# Patient Record
Sex: Female | Born: 1950 | Race: White | Hispanic: No | Marital: Single | Smoking: Former smoker
Health system: Southern US, Community
[De-identification: ages and names within clinical notes are randomized; demographics above are authoritative.]

## PROBLEM LIST (undated history)

## (undated) DIAGNOSIS — E785 Hyperlipidemia, unspecified: Secondary | ICD-10-CM

## (undated) DIAGNOSIS — M1990 Unspecified osteoarthritis, unspecified site: Secondary | ICD-10-CM

## (undated) DIAGNOSIS — I10 Essential (primary) hypertension: Secondary | ICD-10-CM

## (undated) DIAGNOSIS — J45909 Unspecified asthma, uncomplicated: Secondary | ICD-10-CM

## (undated) DIAGNOSIS — E669 Obesity, unspecified: Secondary | ICD-10-CM

## (undated) DIAGNOSIS — N189 Chronic kidney disease, unspecified: Secondary | ICD-10-CM

## (undated) DIAGNOSIS — R5383 Other fatigue: Secondary | ICD-10-CM

## (undated) DIAGNOSIS — M810 Age-related osteoporosis without current pathological fracture: Secondary | ICD-10-CM

## (undated) HISTORY — DX: Chronic kidney disease, unspecified: N18.9

## (undated) HISTORY — DX: Other fatigue: R53.83

## (undated) HISTORY — DX: Unspecified osteoarthritis, unspecified site: M19.90

## (undated) HISTORY — DX: Unspecified asthma, uncomplicated: J45.909

## (undated) HISTORY — DX: Hyperlipidemia, unspecified: E78.5

## (undated) HISTORY — DX: Obesity, unspecified: E66.9

## (undated) HISTORY — DX: Essential (primary) hypertension: I10

## (undated) HISTORY — PX: MANDIBLE SURGERY: SHX707

## (undated) HISTORY — DX: Age-related osteoporosis without current pathological fracture: M81.0

## (undated) HISTORY — PX: NASAL POLYP EXCISION: SHX2068

## (undated) HISTORY — PX: CATARACT EXTRACTION: SUR2

---

## 1990-02-11 HISTORY — PX: MAXILLA OSTEOTOMY: SHX1008

## 2014-09-30 DIAGNOSIS — H5213 Myopia, bilateral: Secondary | ICD-10-CM | POA: Diagnosis not present

## 2014-09-30 DIAGNOSIS — H25043 Posterior subcapsular polar age-related cataract, bilateral: Secondary | ICD-10-CM | POA: Diagnosis not present

## 2014-09-30 DIAGNOSIS — H25012 Cortical age-related cataract, left eye: Secondary | ICD-10-CM | POA: Diagnosis not present

## 2014-09-30 DIAGNOSIS — H2513 Age-related nuclear cataract, bilateral: Secondary | ICD-10-CM | POA: Diagnosis not present

## 2014-10-07 DIAGNOSIS — N182 Chronic kidney disease, stage 2 (mild): Secondary | ICD-10-CM | POA: Diagnosis not present

## 2014-10-07 DIAGNOSIS — E559 Vitamin D deficiency, unspecified: Secondary | ICD-10-CM | POA: Diagnosis not present

## 2014-10-07 DIAGNOSIS — E782 Mixed hyperlipidemia: Secondary | ICD-10-CM | POA: Diagnosis not present

## 2014-10-10 DIAGNOSIS — T148 Other injury of unspecified body region: Secondary | ICD-10-CM | POA: Diagnosis not present

## 2014-10-10 DIAGNOSIS — Z23 Encounter for immunization: Secondary | ICD-10-CM | POA: Diagnosis not present

## 2014-10-10 DIAGNOSIS — N183 Chronic kidney disease, stage 3 (moderate): Secondary | ICD-10-CM | POA: Diagnosis not present

## 2014-10-10 DIAGNOSIS — Z1389 Encounter for screening for other disorder: Secondary | ICD-10-CM | POA: Diagnosis not present

## 2014-10-10 DIAGNOSIS — J45909 Unspecified asthma, uncomplicated: Secondary | ICD-10-CM | POA: Diagnosis not present

## 2014-10-10 DIAGNOSIS — R5383 Other fatigue: Secondary | ICD-10-CM | POA: Diagnosis not present

## 2014-10-10 DIAGNOSIS — Z6834 Body mass index (BMI) 34.0-34.9, adult: Secondary | ICD-10-CM | POA: Diagnosis not present

## 2014-10-10 DIAGNOSIS — Z0000 Encounter for general adult medical examination without abnormal findings: Secondary | ICD-10-CM | POA: Diagnosis not present

## 2014-10-10 DIAGNOSIS — I10 Essential (primary) hypertension: Secondary | ICD-10-CM | POA: Diagnosis not present

## 2014-10-10 DIAGNOSIS — M810 Age-related osteoporosis without current pathological fracture: Secondary | ICD-10-CM | POA: Diagnosis not present

## 2014-10-10 DIAGNOSIS — E782 Mixed hyperlipidemia: Secondary | ICD-10-CM | POA: Diagnosis not present

## 2014-10-10 DIAGNOSIS — E559 Vitamin D deficiency, unspecified: Secondary | ICD-10-CM | POA: Diagnosis not present

## 2014-10-18 DIAGNOSIS — Z1212 Encounter for screening for malignant neoplasm of rectum: Secondary | ICD-10-CM | POA: Diagnosis not present

## 2014-11-09 ENCOUNTER — Ambulatory Visit (INDEPENDENT_AMBULATORY_CARE_PROVIDER_SITE_OTHER): Payer: Medicare Other | Admitting: Neurology

## 2014-11-09 ENCOUNTER — Encounter: Payer: Self-pay | Admitting: Neurology

## 2014-11-09 VITALS — BP 134/92 | HR 88 | Resp 16 | Ht 67.25 in | Wt 222.0 lb

## 2014-11-09 DIAGNOSIS — R351 Nocturia: Secondary | ICD-10-CM | POA: Diagnosis not present

## 2014-11-09 DIAGNOSIS — E669 Obesity, unspecified: Secondary | ICD-10-CM

## 2014-11-09 DIAGNOSIS — G2581 Restless legs syndrome: Secondary | ICD-10-CM | POA: Diagnosis not present

## 2014-11-09 DIAGNOSIS — R0683 Snoring: Secondary | ICD-10-CM | POA: Diagnosis not present

## 2014-11-09 DIAGNOSIS — R600 Localized edema: Secondary | ICD-10-CM | POA: Diagnosis not present

## 2014-11-09 DIAGNOSIS — G4719 Other hypersomnia: Secondary | ICD-10-CM

## 2014-11-09 NOTE — Patient Instructions (Signed)

## 2014-11-09 NOTE — Progress Notes (Signed)
Subjective:    [name] ID: Traci [name] is a 64 y.o. female.  HPI     [name], MD, [name], [name], [name]  Dear Dr. Dagmar Hait,   I saw your [name], Traci [name], upon your kind request in my neurologic clinic today for initial consultation of her sleep disorder, in particular, concern for underlying obstructive sleep apnea. Traci [name] is unaccompanied today. As you know, Traci [name] is a very pleasant 64 year old right-handed woman with an underlying medical history of allergic rhinitis, asthma, LE edema, hypertension, chronic kidney disease, osteoporosis, hyperlipidemia, vitamin D deficiency, degenerative joint disease, and obesity, who reports snoring and excessive daytime somnolence, not waking up rested for Traci past 6 months or more. I reviewed your office note from 10/12/2014 which you kindly included. She has had chronic LE distal edema for years, maybe 20+ years. She gained weight consistently since menopause. She has sinus issues. She had sinus surgery some 16 years ago.  She has occasional RLS symptoms, but does not know if she kicks in her sleep.  She lives alone. She has 2 dogs and 1, and no pets in her bed. She goes to bed around 9 PM and falls asleep fairly quickly. She does wake up multiple times in Traci middle of Traci night often from itching in her legs from her swelling and rash. Sometimes she uses cortisone cream on her legs. She has had no morning headaches. She has nocturia 2 or 3 times on an average night. She drinks caffeine in Traci form of coffee in Traci morning and ice tea in Traci middle of Traci day. She drinks 2 glasses of wine with dinner. She quit smoking in 1977. Her brother has obstructive sleep apnea and uses a CPAP machine. He also had heart surgery secondary to congenital heart disease. She takes multiple naps during Traci day. [name], [name] She works from home. She is an Training and development officer.  Her Past Medical History Is Significant For: Past Medical History  Diagnosis Date  . Asthma   . Osteoporosis   . Hypertension   . Chronic kidney disease   . DJD (degenerative joint disease)   . Hyperlipidemia   . Fatigue   . Obesity     Her Past Surgical History Is Significant For: Past Surgical History  Procedure Laterality Date  . Mandible surgery    . Nasal polyp excision    . Cataract extraction    . Maxilla osteotomy  1992    Her Family History Is Significant For: Family History  Problem Relation Age of Onset  . Hypertension Mother   . Parkinson's disease Father   . Heart Problems Brother   . Angina Maternal Grandmother   . Heart attack Maternal Grandfather     Her Social History Is Significant For: Social History   Social History  . Marital Status: Single    Spouse Name: N/A  . Number of Children: 0  . Years of Education: Masters   Occupational History  . Self Employeed    Social History Main Topics  . Smoking status: Former Research scientist (life sciences)  . Smokeless tobacco: None     Comment: Quit 1977  . Alcohol Use: No  . Drug Use:  No  . Sexual Activity: Not Asked   Other Topics Concern  . None   Social History Narrative   Drinks about 1 cup of coffee a day     Her Allergies Are:  No Known Allergies:   Her Current Medications Are:  Outpatient Encounter Prescriptions as of 11/09/2014  Medication Sig  . alendronate (FOSAMAX) 10 MG tablet Take 10 mg by mouth daily before breakfast. Take with a full glass of water on an empty stomach.  Marland Kitchen amLODipine (NORVASC) 5 MG tablet Take 5 mg by mouth daily.  Marland Kitchen aspirin EC 325 MG tablet Take 325 mg by mouth daily.  Marland Kitchen atorvastatin (LIPITOR) 10 MG tablet Take 10 mg by mouth daily.  . Fluticasone-Salmeterol (ADVAIR) 250-50 MCG/DOSE AEPB Inhale 1  puff into Traci lungs 2 (two) times daily.  Marland Kitchen losartan (COZAAR) 50 MG tablet Take 50 mg by mouth daily.  . traMADol (ULTRAM) 50 MG tablet Take by mouth every 6 (six) hours as needed.  . Vitamin D, Ergocalciferol, (DRISDOL) 50000 UNITS CAPS capsule Take 50,000 Units by mouth every 7 (seven) days.   No facility-administered encounter medications on file as of 11/09/2014.  :  Review of Systems:  Out of a complete 14 point review of systems, all are reviewed and negative with Traci exception of these symptoms as listed below:  Review of Systems  Constitutional: Positive for fatigue.  Eyes:       Blurred vision   Respiratory: Positive for shortness of breath and wheezing.   Cardiovascular: Positive for leg swelling.  Musculoskeletal:       Joint pain   Allergic/Immunologic: Positive for environmental allergies.  Neurological:       Sometimes has trouble falling asleep, nocturia, wakes up feeling tired especially in last year, takes naps during Traci day, daytime tiredness.    Psychiatric/Behavioral:       Decreased energy     Objective:  Neurologic Exam  Physical Exam Physical Examination:   Filed Vitals:   11/09/14 1012  BP: 134/92  Pulse: 88  Resp: 16   General Examination: Traci [name] is a very pleasant 64 y.o. female in no acute distress. She appears well-developed and well-nourished and well groomed.   HEENT: Normocephalic, atraumatic, pupils are equal, round and reactive to light and accommodation. Funduscopic exam is normal with sharp disc margins noted. Extraocular tracking is good without limitation to gaze excursion or nystagmus noted. Normal smooth pursuit is noted. Hearing is grossly intact. She has bilateral cataracts, left more than right. Face is symmetric with normal facial animation and normal facial sensation. Speech is clear with no dysarthria noted. There is no hypophonia. There is no lip, neck/head, jaw or voice tremor. Neck is supple with full range of passive and  active motion. There are no carotid bruits on auscultation. Oropharynx exam reveals: mild mouth dryness, adequate dental hygiene and mild airway crowding, due to narrow airway entry. Mallampati is class II. Tongue protrudes centrally and palate elevates symmetrically. Tonsils are small or (? Absent). Neck size is 17 3/8 inches. She has a Mild overbite. Nasal inspection reveals no significant nasal mucosal bogginess or redness and no septal deviation.   Chest: Clear to auscultation without wheezing, rhonchi or crackles noted.  Heart: S1+S2+0, regular and normal without murmurs, rubs or gallops noted.   Abdomen: Soft, non-tender and non-distended with normal bowel sounds appreciated on auscultation.  Extremities: There is 2+ pitting edema in Traci distal lower extremities bilaterally. Pedal pulses are intact.  Skin: Warm and  dry with trophic changes noted and mild redness and a rash, stasis-type. There are no varicose veins.  Musculoskeletal: exam reveals no obvious joint deformities, tenderness or joint swelling or erythema.   Neurologically:  Mental status: Traci [name] is awake, alert and oriented in all 4 spheres. Her immediate and remote memory, attention, language skills and fund of knowledge are appropriate. There is no evidence of aphasia, agnosia, apraxia or anomia. Speech is clear with normal prosody and enunciation. Thought process is linear. Mood is normal and affect is normal.  Cranial nerves II - XII are as described above under HEENT exam. In addition: shoulder shrug is normal with equal shoulder height noted. Motor exam: Normal bulk, strength and tone is noted. There is no drift, tremor or rebound. Romberg is negative. Reflexes are 2+ throughout. Babinski: Toes are flexor bilaterally. Fine motor skills and coordination: intact with normal finger taps, normal hand movements, normal rapid alternating patting, normal foot taps and normal foot agility.  Cerebellar testing: No dysmetria or  intention tremor on finger to nose testing. Heel to shin is unremarkable bilaterally. There is no truncal or gait ataxia.  Sensory exam: intact to light touch, pinprick, vibration, temperature sense in Traci upper and lower extremities.  Gait, station and balance: She stands easily. No veering to one side is noted. No leaning to one side is noted. Posture is age-appropriate and stance is narrow based. Gait shows normal stride length and normal pace. No problems turning are noted. She turns en bloc. Tandem walk is difficult for her.   Assessment and Plan:   In summary, Traci [name] is a very pleasant 64 y.o.-year old female with an underlying medical history of allergic rhinitis, asthma, hypertension, chronic kidney disease, osteoporosis, hyperlipidemia, vitamin D deficiency, degenerative joint disease, and obesity, whose history and physical exam are indeed concerning for underlying obstructive sleep apnea (OSA). in addition, she endorses restless leg syndrome type symptoms.  I had a long chat with Traci [name] about my findings and Traci diagnosis of OSA, its prognosis and treatment options. We talked about medical treatments, surgical interventions and non-pharmacological approaches. I explained in particular Traci risks and ramifications of untreated moderate to severe OSA, especially with respect to developing cardiovascular disease down Traci Road, including congestive heart failure, difficult to treat hypertension, cardiac arrhythmias, or stroke. Even type 2 diabetes has, in part, been linked to untreated OSA. Symptoms of untreated OSA include daytime sleepiness, memory problems, mood irritability and mood disorder such as depression and anxiety, lack of energy, as well as recurrent headaches, especially morning headaches. We talked about trying to maintain a healthy lifestyle in general, as well as Traci importance of weight control. I encouraged Traci [name] to eat healthy, exercise daily and keep well  hydrated, to keep a scheduled bedtime and wake time routine, to not skip any meals and eat healthy snacks in between meals. I advised Traci [name] not to drive when feeling sleepy. I recommended Traci following at this time: sleep study with potential positive airway pressure titration. (We will score hypopneas at 4% and split Traci sleep study into diagnostic and treatment portion, if Traci estimated. 2 hour AHI is >15/h).   I explained Traci sleep test procedure to Traci [name] and also outlined possible surgical and non-surgical treatment options of OSA, including Traci use of a custom-made dental device (which would require a referral to a specialist dentist or oral surgeon), upper airway surgical options, such as pillar implants, radiofrequency surgery, tongue base surgery, and UPPP (  which would involve a referral to an ENT surgeon). Rarely, jaw surgery such as mandibular advancement may be considered.  I also explained Traci CPAP treatment option to Traci [name], who indicated that she would be willing to try CPAP if Traci need arises. I explained Traci importance of being compliant with PAP treatment, not only for insurance purposes but primarily to improve Her symptoms, and for Traci [name]'s long term health benefit, including to reduce Her cardiovascular risks. I answered all her questions today and Traci [name] was in agreement. I would like to see her back after Traci sleep study is completed and encouraged her to call with any interim questions, concerns, problems or updates.   Thank you very much for allowing me to participate in Traci care of this nice [name]. If I can be of any further assistance to you please do not hesitate to call me at [phone].  Sincerely,   [name], MD, PhD

## 2014-11-23 ENCOUNTER — Ambulatory Visit (INDEPENDENT_AMBULATORY_CARE_PROVIDER_SITE_OTHER): Payer: Medicare Other | Admitting: Neurology

## 2014-11-23 ENCOUNTER — Encounter (INDEPENDENT_AMBULATORY_CARE_PROVIDER_SITE_OTHER): Payer: Self-pay | Admitting: Neurology

## 2014-11-23 DIAGNOSIS — E669 Obesity, unspecified: Secondary | ICD-10-CM

## 2014-11-23 DIAGNOSIS — G4710 Hypersomnia, unspecified: Secondary | ICD-10-CM

## 2014-11-23 DIAGNOSIS — R0683 Snoring: Secondary | ICD-10-CM

## 2014-11-23 DIAGNOSIS — G2581 Restless legs syndrome: Secondary | ICD-10-CM

## 2014-11-23 DIAGNOSIS — R351 Nocturia: Secondary | ICD-10-CM

## 2014-11-23 DIAGNOSIS — R600 Localized edema: Secondary | ICD-10-CM

## 2014-11-23 DIAGNOSIS — Z0289 Encounter for other administrative examinations: Secondary | ICD-10-CM

## 2014-11-23 DIAGNOSIS — G4719 Other hypersomnia: Secondary | ICD-10-CM

## 2014-11-23 NOTE — Sleep Study (Signed)
Please see the scanned sleep study interpretation located in the procedure tab in the chart view section.  

## 2014-11-24 NOTE — Sleep Study (Signed)
Please see the scanned sleep study interpretation located in the procedure tab within the chart review section.   

## 2014-11-28 ENCOUNTER — Telehealth: Payer: Self-pay | Admitting: Neurology

## 2014-11-28 DIAGNOSIS — G4733 Obstructive sleep apnea (adult) (pediatric): Secondary | ICD-10-CM

## 2014-11-28 DIAGNOSIS — G4734 Idiopathic sleep related nonobstructive alveolar hypoventilation: Secondary | ICD-10-CM

## 2014-11-28 NOTE — Telephone Encounter (Signed)
Patient referred by Dr. Dagmar Hait, seen by me on 11/09/14, diagnostic PSG on 11/23/14, ins: MCR.   Please call and notify the patient that the recent sleep study did confirm the diagnosis of obstructive sleep apnea, which was severe in dream sleep with significant desaturations into the 70s even, and that I recommend treatment for this in the form of CPAP. This will require a repeat sleep study for proper titration and mask fitting. Please explain to patient and arrange for a CPAP titration study. I have placed an order in the chart. Thanks, and please route to [facility] for scheduling next sleep study.  [name], MD, PhD [facility])

## 2014-11-29 NOTE — Telephone Encounter (Signed)
I spoke to patient and she is aware of results and recommendations. She is willing to proceed with titration study but would like to hold off for 4-6 weeks. Patient states that she is new to Medicare and has not received any bills yet. She is afraid that she will not be able to afford 2nd study. Carlea says that she wants to call us back in 4-6 weeks to schedule study. She is aware of how low her oxygen levels drop during the night.

## 2014-11-29 NOTE — Telephone Encounter (Signed)
[phone], Pt returned call about results

## 2014-11-29 NOTE — Telephone Encounter (Signed)
Left message to call back for results

## 2015-04-04 DIAGNOSIS — H25013 Cortical age-related cataract, bilateral: Secondary | ICD-10-CM | POA: Diagnosis not present

## 2015-04-04 DIAGNOSIS — H25043 Posterior subcapsular polar age-related cataract, bilateral: Secondary | ICD-10-CM | POA: Diagnosis not present

## 2015-04-04 DIAGNOSIS — H2513 Age-related nuclear cataract, bilateral: Secondary | ICD-10-CM | POA: Diagnosis not present

## 2015-04-04 DIAGNOSIS — H35033 Hypertensive retinopathy, bilateral: Secondary | ICD-10-CM | POA: Diagnosis not present

## 2015-04-04 DIAGNOSIS — H35363 Drusen (degenerative) of macula, bilateral: Secondary | ICD-10-CM | POA: Diagnosis not present

## 2015-04-21 DIAGNOSIS — H25043 Posterior subcapsular polar age-related cataract, bilateral: Secondary | ICD-10-CM | POA: Diagnosis not present

## 2015-04-21 DIAGNOSIS — H2512 Age-related nuclear cataract, left eye: Secondary | ICD-10-CM | POA: Diagnosis not present

## 2015-04-21 DIAGNOSIS — H25013 Cortical age-related cataract, bilateral: Secondary | ICD-10-CM | POA: Diagnosis not present

## 2015-04-21 DIAGNOSIS — H2513 Age-related nuclear cataract, bilateral: Secondary | ICD-10-CM | POA: Diagnosis not present

## 2015-05-03 DIAGNOSIS — E668 Other obesity: Secondary | ICD-10-CM | POA: Diagnosis not present

## 2015-05-03 DIAGNOSIS — Z6835 Body mass index (BMI) 35.0-35.9, adult: Secondary | ICD-10-CM | POA: Diagnosis not present

## 2015-05-03 DIAGNOSIS — M810 Age-related osteoporosis without current pathological fracture: Secondary | ICD-10-CM | POA: Diagnosis not present

## 2015-05-03 DIAGNOSIS — Z1389 Encounter for screening for other disorder: Secondary | ICD-10-CM | POA: Diagnosis not present

## 2015-05-03 DIAGNOSIS — I10 Essential (primary) hypertension: Secondary | ICD-10-CM | POA: Diagnosis not present

## 2015-05-03 DIAGNOSIS — G4733 Obstructive sleep apnea (adult) (pediatric): Secondary | ICD-10-CM | POA: Diagnosis not present

## 2015-05-03 DIAGNOSIS — J3089 Other allergic rhinitis: Secondary | ICD-10-CM | POA: Diagnosis not present

## 2015-05-03 DIAGNOSIS — J45998 Other asthma: Secondary | ICD-10-CM | POA: Diagnosis not present

## 2015-05-03 DIAGNOSIS — N183 Chronic kidney disease, stage 3 (moderate): Secondary | ICD-10-CM | POA: Diagnosis not present

## 2015-05-17 DIAGNOSIS — H2512 Age-related nuclear cataract, left eye: Secondary | ICD-10-CM | POA: Diagnosis not present

## 2015-05-17 DIAGNOSIS — H25812 Combined forms of age-related cataract, left eye: Secondary | ICD-10-CM | POA: Diagnosis not present

## 2015-05-24 DIAGNOSIS — H25011 Cortical age-related cataract, right eye: Secondary | ICD-10-CM | POA: Diagnosis not present

## 2015-05-24 DIAGNOSIS — H2511 Age-related nuclear cataract, right eye: Secondary | ICD-10-CM | POA: Diagnosis not present

## 2015-05-24 DIAGNOSIS — H25041 Posterior subcapsular polar age-related cataract, right eye: Secondary | ICD-10-CM | POA: Diagnosis not present

## 2015-05-31 DIAGNOSIS — H2511 Age-related nuclear cataract, right eye: Secondary | ICD-10-CM | POA: Diagnosis not present

## 2015-10-20 DIAGNOSIS — I10 Essential (primary) hypertension: Secondary | ICD-10-CM | POA: Diagnosis not present

## 2015-10-20 DIAGNOSIS — N183 Chronic kidney disease, stage 3 (moderate): Secondary | ICD-10-CM | POA: Diagnosis not present

## 2015-10-20 DIAGNOSIS — E559 Vitamin D deficiency, unspecified: Secondary | ICD-10-CM | POA: Diagnosis not present

## 2015-10-20 DIAGNOSIS — E782 Mixed hyperlipidemia: Secondary | ICD-10-CM | POA: Diagnosis not present

## 2015-10-26 DIAGNOSIS — J45909 Unspecified asthma, uncomplicated: Secondary | ICD-10-CM | POA: Diagnosis not present

## 2015-10-26 DIAGNOSIS — E782 Mixed hyperlipidemia: Secondary | ICD-10-CM | POA: Diagnosis not present

## 2015-10-26 DIAGNOSIS — E669 Obesity, unspecified: Secondary | ICD-10-CM | POA: Diagnosis not present

## 2015-10-26 DIAGNOSIS — Z0000 Encounter for general adult medical examination without abnormal findings: Secondary | ICD-10-CM | POA: Diagnosis not present

## 2015-10-26 DIAGNOSIS — M1990 Unspecified osteoarthritis, unspecified site: Secondary | ICD-10-CM | POA: Diagnosis not present

## 2015-10-26 DIAGNOSIS — M810 Age-related osteoporosis without current pathological fracture: Secondary | ICD-10-CM | POA: Diagnosis not present

## 2015-10-26 DIAGNOSIS — N183 Chronic kidney disease, stage 3 (moderate): Secondary | ICD-10-CM | POA: Diagnosis not present

## 2015-10-26 DIAGNOSIS — Z23 Encounter for immunization: Secondary | ICD-10-CM | POA: Diagnosis not present

## 2015-10-26 DIAGNOSIS — I10 Essential (primary) hypertension: Secondary | ICD-10-CM | POA: Diagnosis not present

## 2015-10-26 DIAGNOSIS — G4733 Obstructive sleep apnea (adult) (pediatric): Secondary | ICD-10-CM | POA: Diagnosis not present

## 2015-10-30 DIAGNOSIS — Z1212 Encounter for screening for malignant neoplasm of rectum: Secondary | ICD-10-CM | POA: Diagnosis not present

## 2015-10-31 DIAGNOSIS — N183 Chronic kidney disease, stage 3 (moderate): Secondary | ICD-10-CM | POA: Diagnosis not present

## 2015-10-31 DIAGNOSIS — M810 Age-related osteoporosis without current pathological fracture: Secondary | ICD-10-CM | POA: Diagnosis not present

## 2015-11-14 DIAGNOSIS — Z1211 Encounter for screening for malignant neoplasm of colon: Secondary | ICD-10-CM | POA: Diagnosis not present

## 2015-11-14 DIAGNOSIS — Z1212 Encounter for screening for malignant neoplasm of rectum: Secondary | ICD-10-CM | POA: Diagnosis not present

## 2015-12-08 ENCOUNTER — Encounter: Payer: Self-pay | Admitting: Internal Medicine

## 2015-12-20 ENCOUNTER — Ambulatory Visit (HOSPITAL_COMMUNITY)
Admission: RE | Admit: 2015-12-20 | Discharge: 2015-12-20 | Disposition: A | Discharge status: Home / Self Care | Payer: Medicare Other | Source: Ambulatory Visit | Attending: Vascular Surgery | Admitting: Vascular Surgery

## 2015-12-20 ENCOUNTER — Other Ambulatory Visit (HOSPITAL_COMMUNITY): Payer: Self-pay | Admitting: Internal Medicine

## 2015-12-20 DIAGNOSIS — I82442 Acute embolism and thrombosis of left tibial vein: Secondary | ICD-10-CM | POA: Diagnosis not present

## 2015-12-20 DIAGNOSIS — I82492 Acute embolism and thrombosis of other specified deep vein of left lower extremity: Secondary | ICD-10-CM | POA: Diagnosis not present

## 2015-12-20 DIAGNOSIS — I82432 Acute embolism and thrombosis of left popliteal vein: Secondary | ICD-10-CM | POA: Insufficient documentation

## 2015-12-20 DIAGNOSIS — M7989 Other specified soft tissue disorders: Secondary | ICD-10-CM

## 2015-12-20 DIAGNOSIS — I10 Essential (primary) hypertension: Secondary | ICD-10-CM | POA: Diagnosis not present

## 2015-12-20 DIAGNOSIS — R0609 Other forms of dyspnea: Secondary | ICD-10-CM | POA: Diagnosis not present

## 2015-12-21 ENCOUNTER — Telehealth: Payer: Self-pay | Admitting: Cardiology

## 2015-12-21 ENCOUNTER — Ambulatory Visit (INDEPENDENT_AMBULATORY_CARE_PROVIDER_SITE_OTHER)
Admission: RE | Admit: 2015-12-21 | Discharge: 2015-12-21 | Disposition: A | Discharge status: Home / Self Care | Payer: Medicare Other | Source: Ambulatory Visit | Attending: Cardiology | Admitting: Cardiology

## 2015-12-21 ENCOUNTER — Other Ambulatory Visit: Payer: Medicare Other

## 2015-12-21 ENCOUNTER — Other Ambulatory Visit: Payer: Self-pay | Admitting: Cardiovascular Disease

## 2015-12-21 ENCOUNTER — Telehealth: Payer: Self-pay | Admitting: *Deleted

## 2015-12-21 ENCOUNTER — Other Ambulatory Visit: Payer: Medicare Other | Admitting: *Deleted

## 2015-12-21 ENCOUNTER — Ambulatory Visit (INDEPENDENT_AMBULATORY_CARE_PROVIDER_SITE_OTHER): Payer: Medicare Other | Admitting: Cardiology

## 2015-12-21 VITALS — BP 119/77 | HR 112 | Ht 67.0 in | Wt 225.4 lb

## 2015-12-21 DIAGNOSIS — I2699 Other pulmonary embolism without acute cor pulmonale: Secondary | ICD-10-CM

## 2015-12-21 DIAGNOSIS — I10 Essential (primary) hypertension: Secondary | ICD-10-CM

## 2015-12-21 DIAGNOSIS — R0609 Other forms of dyspnea: Secondary | ICD-10-CM | POA: Diagnosis not present

## 2015-12-21 DIAGNOSIS — R0602 Shortness of breath: Secondary | ICD-10-CM | POA: Diagnosis not present

## 2015-12-21 LAB — BASIC METABOLIC PANEL
ANION GAP: 11 (ref 5–15)
BUN: 24 mg/dL — ABNORMAL HIGH (ref 6–20)
CHLORIDE: 100 mmol/L — AB (ref 101–111)
CO2: 24 mmol/L (ref 22–32)
Calcium: 9 mg/dL (ref 8.9–10.3)
Creatinine, Ser: 1.36 mg/dL — ABNORMAL HIGH (ref 0.44–1.00)
GFR calc non Af Amer: 40 mL/min — ABNORMAL LOW (ref >60)
GFR, EST AFRICAN AMERICAN: 46 mL/min — AB (ref >60)
Glucose, Bld: 105 mg/dL — ABNORMAL HIGH (ref 65–99)
POTASSIUM: 4.3 mmol/L (ref 3.5–5.1)
SODIUM: 135 mmol/L (ref 135–145)

## 2015-12-21 MED ORDER — IOPAMIDOL (ISOVUE-370) INJECTION 76%
80.0000 mL | Freq: Once | INTRAVENOUS | Status: AC | PRN
  Administered 2015-12-21: 80 mL via INTRAVENOUS

## 2015-12-21 NOTE — Telephone Encounter (Signed)
New message  STAT  Adam @ [facility] called, is waiting on labs done this am, STAT  Has not received  Please call and follow up

## 2015-12-21 NOTE — Progress Notes (Signed)
Bmp

## 2015-12-21 NOTE — Telephone Encounter (Signed)
Discussed with Dr Dagmar Hait; pt noted to have PE on CT scan; she some DOE but otherwise no symptoms; she is not hypotensive and did not have hypoxia in Dr Danna Hefty office; already on apixaban. Dr Danna Hefty office will contact pt for close FU; cardiology will see PRN. Kirk Ruths

## 2015-12-21 NOTE — Telephone Encounter (Signed)
Spoke with Quita Skye he states that he does not have any samples for pt he will call lab and see what happened and call back with info

## 2015-12-21 NOTE — Telephone Encounter (Signed)
Received incoming call from Amy at Dr Avva's office. Amy stated Dr Stanford Breed and Dr Dagmar Hait had already discussed plan of care for patient. She gave me the information that she will call patient before the end of the day and reschedule f/u with Dr Dagmar Hait in 2 weeks. Patient already on Eliquis therapy. Amy is to f/u up with patient.

## 2015-12-21 NOTE — Telephone Encounter (Signed)
Received incoming call from [place] at [facility]. Patient does have a PE.  As noted in EPIC, Dr Toney Reil has notified Dr Stanford Breed today at 2:50 pm.  Zar was calling to let nurse know. Routing to Dr Stanford Breed for further instructions.

## 2015-12-21 NOTE — Progress Notes (Signed)
HPI: 65 year old female for evaluation of dyspnea. Patient does have some dyspnea on exertion chronically that she attributes to asthma. However over the past 4-5 weeks she has had increased dyspnea on exertion. No orthopnea or PND. 4 days ago she had increased edema and pain in her left lower extremity. She apparently was sent for lower extremity venous Dopplers which showed DVT and she was placed on apixaban. She denies chest pain, palpitations or syncope. Cardiology now asked to evaluate. Patient did travel to [place] to pick up a new puppy 4 weeks ago. However her dyspnea began prior to that. She has had no recent surgeries.   Current Outpatient Prescriptions  Medication Sig Dispense Refill  . alendronate (FOSAMAX) 10 MG tablet Take 10 mg by mouth daily before breakfast. Take with a full glass of water on an empty stomach.    Marland Kitchen amLODipine (NORVASC) 5 MG tablet Take 5 mg by mouth daily.    Marland Kitchen apixaban (ELIQUIS) 5 MG TABS tablet Take 10 mg by mouth 2 (two) times daily.    Marland Kitchen aspirin EC 325 MG tablet Take 325 mg by mouth daily.    . Fluticasone-Salmeterol (ADVAIR) 250-50 MCG/DOSE AEPB Inhale 1 puff into the lungs 2 (two) times daily.    Marland Kitchen losartan (COZAAR) 50 MG tablet Take 50 mg by mouth daily.    . traMADol (ULTRAM) 50 MG tablet Take by mouth every 6 (six) hours as needed.    . Vitamin D, Ergocalciferol, (DRISDOL) 50000 UNITS CAPS capsule Take 50,000 Units by mouth every 7 (seven) days.     No current facility-administered medications for this visit.     No Known Allergies   Past Medical History:  Diagnosis Date  . Asthma   . Chronic kidney disease   . DJD (degenerative joint disease)   . Fatigue   . Hyperlipidemia   . Hypertension   . Obesity   . Osteoporosis     Past Surgical History:  Procedure Laterality Date  . CATARACT EXTRACTION    . MANDIBLE SURGERY    . MAXILLA OSTEOTOMY  1992  . NASAL POLYP EXCISION      Social History   Social History  . Marital status:  Single    Spouse name: N/A  . Number of children: 0  . Years of education: Masters   Occupational History  . Self Employeed    Social History Main Topics  . Smoking status: Former Research scientist (life sciences)  . Smokeless tobacco: Not on file     Comment: Quit 1977  . Alcohol use 0.0 oz/week     Comment: Wine with dinner  . Drug use: No  . Sexual activity: Not on file   Other Topics Concern  . Not on file   Social History Narrative   Drinks about 1 cup of coffee a day     Family History  Problem Relation Age of Onset  . Hypertension Mother   . Parkinson's disease Father   . Heart Problems Brother     Congenital heart defect  . Angina Maternal Grandmother   . Heart attack Maternal Grandfather     ROS: left lower extremity swelling but no fevers or chills, productive cough, hemoptysis, dysphasia, odynophagia, melena, hematochezia, dysuria, hematuria, rash, seizure activity, orthopnea, PND,  claudication. Remaining systems are negative.  Physical Exam:   Blood pressure 119/77, pulse (!) 112, height 5\' 7"  (1.702 m), weight 225 lb 6.4 oz (102.2 kg).  General:  Well developed/obese in NAD Skin warm/dry Patient  not depressed No peripheral clubbing Back-normal HEENT-normal/normal eyelids Neck supple/normal carotid upstroke bilaterally; no bruits; no JVD; no thyromegaly chest - CTA/ normal expansion CV - RRR/normal S1 and S2; no murmurs, rubs or gallops;  PMI nondisplaced Abdomen -NT/ND, no HSM, no mass, + bowel sounds, no bruit 2+ femoral pulses, no bruits Ext-2 + edema LLE with mild erythema; trace edema RLE Neuro-grossly nonfocal  ECG sinus tachycardia at a rate of 112. No ST changes.  A/P  1 dyspnea-patient presents for evaluation of dyspnea. She was noted to have increased edema left lower extremity 3 days ago. She had venous Dopplers of her left lower extremity yesterday that showed a DVT by her report. I do not have those records available. She was started on apixaban 10 BID for 7  days and then 5 BID thereafter. We will continue with anticoagulation. The obvious concern would be pulmonary embolus. I will arrange a CTA to exclude. We will arrange an echocardiogram to assess LV and RV function. I have discussed the patient with Dr. Dagmar Hait. The results of her CT will be called to him. She will follow-up for further management of her pulmonary embolus with him as well.  2 left lower extremity DVT-plan as outlined under #1.  3 hypertension-blood pressure controlled. Continue present medications.  Kirk Ruths, MD

## 2015-12-21 NOTE — Patient Instructions (Addendum)
Medication Instructions:  Your physician recommends that you continue on your current medications as directed. Please refer to the Current Medication list given to you today.  Labwork: Your physician recommends that you return for lab work in: [id number]  Testing/Procedures: Non-Cardiac CT Angiography (CTA), is a special type of CT scan that uses a computer to produce multi-dimensional views of major blood vessels throughout the body. In CT angiography, a contrast material is injected through an IV to help visualize the blood vessels  Your physician has requested that you have an echocardiogram. Echocardiography is a painless test that uses sound waves to create images of your heart. It provides your doctor with information about the size and shape of your heart and how well your heart's chambers and valves are working. This procedure takes approximately one hour. There are no restrictions for this procedure.  Follow-Up: Your physician recommends that you schedule a follow-up appointment in: [place].  Any Other Special Instructions Will Be Listed Below (If Applicable). CT is scheduled for today at 1   If you need a refill on your cardiac medications before your next appointment, please call your pharmacy.   CT Instructions Nothing to Eat after 11am TODAY Please continue to drink plenty of fluids Arrival time is 12:50pm TODAY The test take about 15 mintues  For the test to be read take about 45 to 60 minutes

## 2015-12-21 NOTE — Telephone Encounter (Signed)
Follow up      Calling to tell nurse pt does have a PE

## 2015-12-21 NOTE — Telephone Encounter (Signed)
Spoke with Mrs. Ellyson, she is still at [facility]. While on the phone, staff member from there came out to talk to her. We got off the phone.

## 2015-12-21 NOTE — Telephone Encounter (Signed)
Spoke with patient to see if she had received in instruction. She stated she was told that [name] from Dr Danna Hefty office would contact her later.  Called Dr Danna Hefty office to f/u. Left message to call back.

## 2015-12-21 NOTE — Telephone Encounter (Signed)
Follow Up:; ° ° °Returning your call. °

## 2015-12-21 NOTE — Addendum Note (Signed)
Addended by: Ulice Brilliant T on: 12/21/2015 10:15 AM   Modules accepted: Orders

## 2015-12-21 NOTE — Addendum Note (Signed)
Addended by: Eulis Foster on: 12/21/2015 01:15 PM   Modules accepted: Orders

## 2015-12-22 ENCOUNTER — Telehealth: Payer: Self-pay | Admitting: Cardiology

## 2015-12-22 ENCOUNTER — Telehealth: Payer: Self-pay | Admitting: *Deleted

## 2015-12-22 NOTE — Telephone Encounter (Signed)
Left message for pt to call.

## 2015-12-22 NOTE — Telephone Encounter (Signed)
-----   Message from Lelon Perla, MD sent at 12/21/2015  4:20 PM EST ----- Will need repeat BMET Monday (mildly elevated Cr; had CTA to R/O PE). Kirk Ruths, MD

## 2015-12-22 NOTE — Telephone Encounter (Signed)
New message  Pt returning [name]'s call  Please call back

## 2015-12-22 NOTE — Telephone Encounter (Signed)
Patient states she has blood work done regularly at Dr Danna Hefty office and Dr Dagmar Hait has noted elevated Creatinine in the past. Patient reluctant to have repeat labs at this time. Will route to [facility] for further evaluation.

## 2015-12-25 NOTE — Telephone Encounter (Signed)
Will make dr Dagmar Hait aware.

## 2015-12-26 DIAGNOSIS — H26493 Other secondary cataract, bilateral: Secondary | ICD-10-CM | POA: Diagnosis not present

## 2015-12-26 DIAGNOSIS — Z961 Presence of intraocular lens: Secondary | ICD-10-CM | POA: Diagnosis not present

## 2015-12-26 DIAGNOSIS — H35033 Hypertensive retinopathy, bilateral: Secondary | ICD-10-CM | POA: Diagnosis not present

## 2015-12-26 DIAGNOSIS — H35363 Drusen (degenerative) of macula, bilateral: Secondary | ICD-10-CM | POA: Diagnosis not present

## 2015-12-27 NOTE — Telephone Encounter (Signed)
See telephone note.

## 2016-01-02 DIAGNOSIS — I2699 Other pulmonary embolism without acute cor pulmonale: Secondary | ICD-10-CM | POA: Diagnosis not present

## 2016-01-02 DIAGNOSIS — I10 Essential (primary) hypertension: Secondary | ICD-10-CM | POA: Diagnosis not present

## 2016-01-02 DIAGNOSIS — Z6836 Body mass index (BMI) 36.0-36.9, adult: Secondary | ICD-10-CM | POA: Diagnosis not present

## 2016-01-02 DIAGNOSIS — M7989 Other specified soft tissue disorders: Secondary | ICD-10-CM | POA: Diagnosis not present

## 2016-01-02 DIAGNOSIS — R0609 Other forms of dyspnea: Secondary | ICD-10-CM | POA: Diagnosis not present

## 2016-01-16 ENCOUNTER — Ambulatory Visit (HOSPITAL_COMMUNITY): Payer: Medicare Other | Attending: Cardiology

## 2016-01-16 ENCOUNTER — Other Ambulatory Visit: Payer: Self-pay

## 2016-01-16 DIAGNOSIS — I2699 Other pulmonary embolism without acute cor pulmonale: Secondary | ICD-10-CM | POA: Diagnosis not present

## 2016-01-16 DIAGNOSIS — R0609 Other forms of dyspnea: Secondary | ICD-10-CM | POA: Diagnosis not present

## 2016-01-19 ENCOUNTER — Telehealth: Payer: Self-pay | Admitting: *Deleted

## 2016-01-19 ENCOUNTER — Other Ambulatory Visit: Payer: Self-pay | Admitting: Ophthalmology

## 2016-01-19 DIAGNOSIS — K7689 Other specified diseases of liver: Secondary | ICD-10-CM

## 2016-01-19 NOTE — Telephone Encounter (Addendum)
-----   Message from Lelon Perla, MD sent at 01/16/2016 12:06 PM EST ----- Schedule dedicated liver ultrasound Kirk Ruths   Left message for pt to call

## 2016-01-19 NOTE — Telephone Encounter (Signed)
Follow up ° ° °Pt verbalized that she is returning call for rn °

## 2016-01-19 NOTE — Telephone Encounter (Signed)
Dedicated liver US scheduled Wednesday 01/24/16, pt to arrive at 7:15 am for 7:30 scan @ [place]. She will need to be NPO after midnight for the test. Detailed message left for the patient with information regarding time and location of testing. She will call back with problems or questions.

## 2016-01-19 NOTE — Telephone Encounter (Signed)
Left message for pt to call.

## 2016-01-24 ENCOUNTER — Ambulatory Visit (HOSPITAL_COMMUNITY): Payer: Medicare Other

## 2016-02-01 ENCOUNTER — Ambulatory Visit (HOSPITAL_COMMUNITY)
Admission: RE | Admit: 2016-02-01 | Discharge: 2016-02-01 | Disposition: A | Discharge status: Home / Self Care | Payer: Medicare Other | Source: Ambulatory Visit | Attending: Cardiology | Admitting: Cardiology

## 2016-02-01 DIAGNOSIS — K7689 Other specified diseases of liver: Secondary | ICD-10-CM | POA: Diagnosis not present

## 2016-02-01 DIAGNOSIS — Z0389 Encounter for observation for other suspected diseases and conditions ruled out: Secondary | ICD-10-CM | POA: Diagnosis not present

## 2016-04-08 DIAGNOSIS — I10 Essential (primary) hypertension: Secondary | ICD-10-CM | POA: Diagnosis not present

## 2016-04-08 DIAGNOSIS — I82402 Acute embolism and thrombosis of unspecified deep veins of left lower extremity: Secondary | ICD-10-CM | POA: Diagnosis not present

## 2016-04-08 DIAGNOSIS — I2699 Other pulmonary embolism without acute cor pulmonale: Secondary | ICD-10-CM | POA: Diagnosis not present

## 2016-04-08 DIAGNOSIS — N183 Chronic kidney disease, stage 3 (moderate): Secondary | ICD-10-CM | POA: Diagnosis not present

## 2016-04-08 DIAGNOSIS — E782 Mixed hyperlipidemia: Secondary | ICD-10-CM | POA: Diagnosis not present

## 2016-04-08 DIAGNOSIS — M1990 Unspecified osteoarthritis, unspecified site: Secondary | ICD-10-CM | POA: Diagnosis not present

## 2016-04-08 DIAGNOSIS — J45909 Unspecified asthma, uncomplicated: Secondary | ICD-10-CM | POA: Diagnosis not present

## 2016-04-08 DIAGNOSIS — G4733 Obstructive sleep apnea (adult) (pediatric): Secondary | ICD-10-CM | POA: Diagnosis not present

## 2016-04-08 DIAGNOSIS — M810 Age-related osteoporosis without current pathological fracture: Secondary | ICD-10-CM | POA: Diagnosis not present

## 2016-04-08 DIAGNOSIS — Z1389 Encounter for screening for other disorder: Secondary | ICD-10-CM | POA: Diagnosis not present

## 2016-04-08 DIAGNOSIS — E669 Obesity, unspecified: Secondary | ICD-10-CM | POA: Diagnosis not present

## 2016-05-08 DIAGNOSIS — I82402 Acute embolism and thrombosis of unspecified deep veins of left lower extremity: Secondary | ICD-10-CM | POA: Diagnosis not present

## 2016-05-08 DIAGNOSIS — I2699 Other pulmonary embolism without acute cor pulmonale: Secondary | ICD-10-CM | POA: Diagnosis not present

## 2016-05-08 DIAGNOSIS — Z6835 Body mass index (BMI) 35.0-35.9, adult: Secondary | ICD-10-CM | POA: Diagnosis not present

## 2016-05-08 DIAGNOSIS — I8310 Varicose veins of unspecified lower extremity with inflammation: Secondary | ICD-10-CM | POA: Diagnosis not present

## 2016-05-08 DIAGNOSIS — N183 Chronic kidney disease, stage 3 (moderate): Secondary | ICD-10-CM | POA: Diagnosis not present

## 2016-05-08 DIAGNOSIS — L301 Dyshidrosis [pompholyx]: Secondary | ICD-10-CM | POA: Diagnosis not present

## 2016-05-08 DIAGNOSIS — I10 Essential (primary) hypertension: Secondary | ICD-10-CM | POA: Diagnosis not present

## 2016-11-14 DIAGNOSIS — M810 Age-related osteoporosis without current pathological fracture: Secondary | ICD-10-CM | POA: Diagnosis not present

## 2016-11-14 DIAGNOSIS — Z0000 Encounter for general adult medical examination without abnormal findings: Secondary | ICD-10-CM | POA: Diagnosis not present

## 2016-11-14 DIAGNOSIS — E782 Mixed hyperlipidemia: Secondary | ICD-10-CM | POA: Diagnosis not present

## 2016-11-14 DIAGNOSIS — I10 Essential (primary) hypertension: Secondary | ICD-10-CM | POA: Diagnosis not present

## 2016-11-21 DIAGNOSIS — J45909 Unspecified asthma, uncomplicated: Secondary | ICD-10-CM | POA: Diagnosis not present

## 2016-11-21 DIAGNOSIS — Z23 Encounter for immunization: Secondary | ICD-10-CM | POA: Diagnosis not present

## 2016-11-21 DIAGNOSIS — I10 Essential (primary) hypertension: Secondary | ICD-10-CM | POA: Diagnosis not present

## 2016-11-21 DIAGNOSIS — R195 Other fecal abnormalities: Secondary | ICD-10-CM | POA: Diagnosis not present

## 2016-11-21 DIAGNOSIS — Z0000 Encounter for general adult medical examination without abnormal findings: Secondary | ICD-10-CM | POA: Diagnosis not present

## 2016-11-21 DIAGNOSIS — Z6834 Body mass index (BMI) 34.0-34.9, adult: Secondary | ICD-10-CM | POA: Diagnosis not present

## 2016-11-21 DIAGNOSIS — I8310 Varicose veins of unspecified lower extremity with inflammation: Secondary | ICD-10-CM | POA: Diagnosis not present

## 2016-11-21 DIAGNOSIS — G4733 Obstructive sleep apnea (adult) (pediatric): Secondary | ICD-10-CM | POA: Diagnosis not present

## 2016-11-21 DIAGNOSIS — E782 Mixed hyperlipidemia: Secondary | ICD-10-CM | POA: Diagnosis not present

## 2016-11-21 DIAGNOSIS — E668 Other obesity: Secondary | ICD-10-CM | POA: Diagnosis not present

## 2016-11-21 DIAGNOSIS — Z1389 Encounter for screening for other disorder: Secondary | ICD-10-CM | POA: Diagnosis not present

## 2016-11-21 DIAGNOSIS — I2699 Other pulmonary embolism without acute cor pulmonale: Secondary | ICD-10-CM | POA: Diagnosis not present

## 2016-11-26 DIAGNOSIS — Z1212 Encounter for screening for malignant neoplasm of rectum: Secondary | ICD-10-CM | POA: Diagnosis not present

## 2016-11-27 ENCOUNTER — Other Ambulatory Visit: Payer: Self-pay | Admitting: Internal Medicine

## 2016-11-27 DIAGNOSIS — Z1231 Encounter for screening mammogram for malignant neoplasm of breast: Secondary | ICD-10-CM

## 2016-12-05 ENCOUNTER — Other Ambulatory Visit: Payer: Self-pay | Admitting: Internal Medicine

## 2016-12-05 DIAGNOSIS — Z139 Encounter for screening, unspecified: Secondary | ICD-10-CM

## 2016-12-31 DIAGNOSIS — H353131 Nonexudative age-related macular degeneration, bilateral, early dry stage: Secondary | ICD-10-CM | POA: Diagnosis not present

## 2016-12-31 DIAGNOSIS — H04123 Dry eye syndrome of bilateral lacrimal glands: Secondary | ICD-10-CM | POA: Diagnosis not present

## 2016-12-31 DIAGNOSIS — Z961 Presence of intraocular lens: Secondary | ICD-10-CM | POA: Diagnosis not present

## 2016-12-31 DIAGNOSIS — H26492 Other secondary cataract, left eye: Secondary | ICD-10-CM | POA: Diagnosis not present

## 2017-04-02 DIAGNOSIS — I2699 Other pulmonary embolism without acute cor pulmonale: Secondary | ICD-10-CM | POA: Diagnosis not present

## 2017-04-02 DIAGNOSIS — Z23 Encounter for immunization: Secondary | ICD-10-CM | POA: Diagnosis not present

## 2017-04-02 DIAGNOSIS — R195 Other fecal abnormalities: Secondary | ICD-10-CM | POA: Diagnosis not present

## 2017-04-02 DIAGNOSIS — I10 Essential (primary) hypertension: Secondary | ICD-10-CM | POA: Diagnosis not present

## 2017-04-02 DIAGNOSIS — E782 Mixed hyperlipidemia: Secondary | ICD-10-CM | POA: Diagnosis not present

## 2017-04-02 DIAGNOSIS — M1990 Unspecified osteoarthritis, unspecified site: Secondary | ICD-10-CM | POA: Diagnosis not present

## 2017-04-02 DIAGNOSIS — Z6835 Body mass index (BMI) 35.0-35.9, adult: Secondary | ICD-10-CM | POA: Diagnosis not present

## 2017-04-02 DIAGNOSIS — E668 Other obesity: Secondary | ICD-10-CM | POA: Diagnosis not present

## 2017-04-17 IMAGING — CT CT ANGIO CHEST
2 of 7 series · 18 of 46 positions shown · IV contrast (isovue)
Comparison: None.

ADDENDUM:
These results were called by telephone at the time of interpretation
on 12/21/2015 at [DATE] to Dr. AMY PREWITT , who verbally
acknowledged these results.

By: Mark Gerald Federley M.D.
CLINICAL DATA: 65 y/o F; left leg deep venous thrombosis and
shortness of breath.
EXAM:
CT ANGIOGRAPHY CHEST WITH CONTRAST
TECHNIQUE: Multidetector CT imaging of the chest was performed using the
standard protocol during bolus administration of intravenous
contrast. Multiplanar CT image reconstructions and MIPs were
obtained to evaluate the vascular anatomy.
CONTRAST:  80 cc Isovue 370

[Series 5: thins · axial · 0.63mm/px · z∈[-291,-41]mm · 15 of 274 slices shown]
[im 12/274  lung]
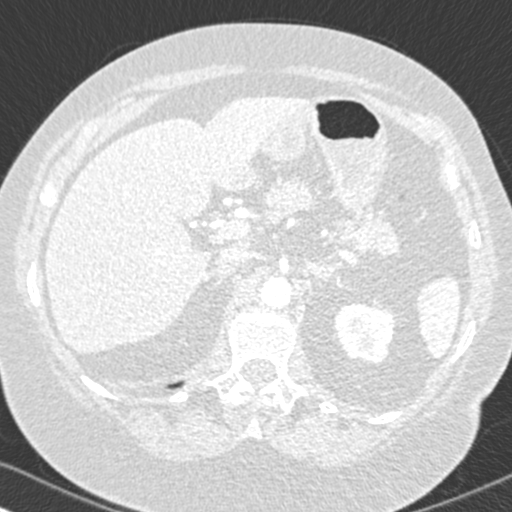
[im 36/274  soft-tissue]
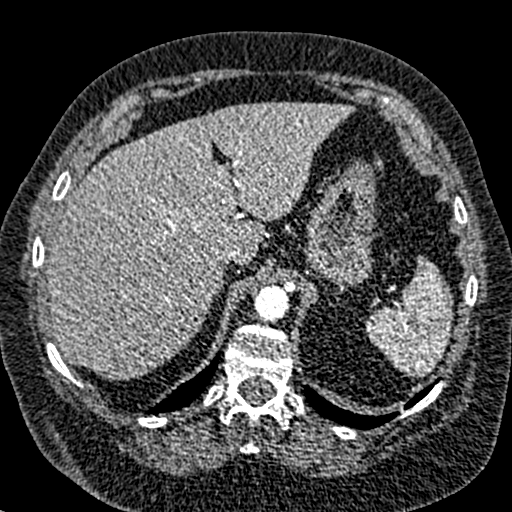
[im 48/274  lung]
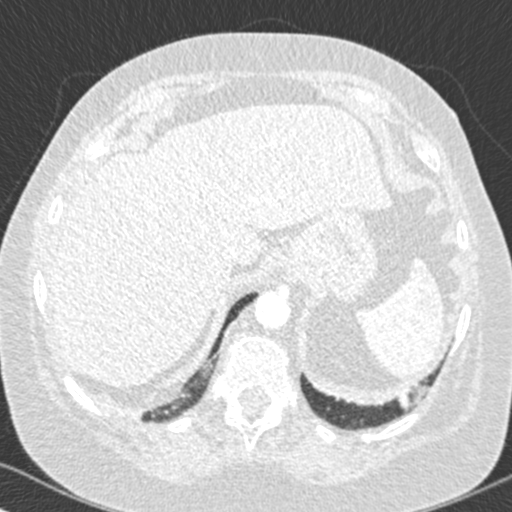
[im 72/274  soft-tissue]
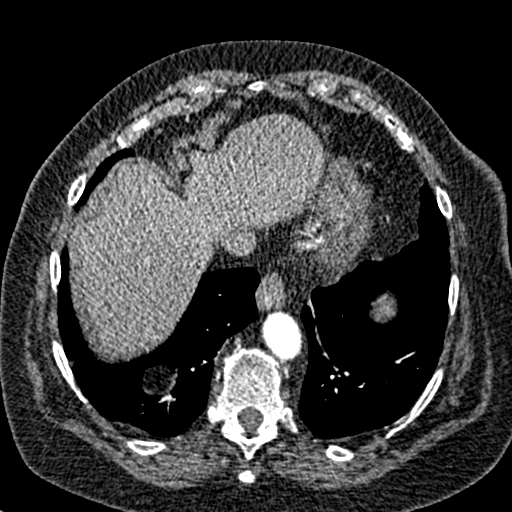
[im 84/274  lung]
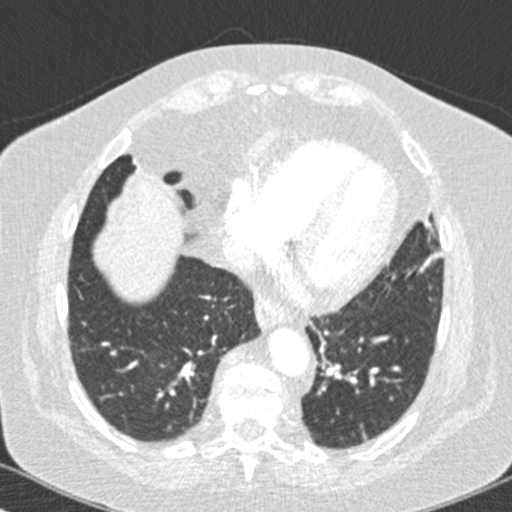
[im 107/274  soft-tissue]
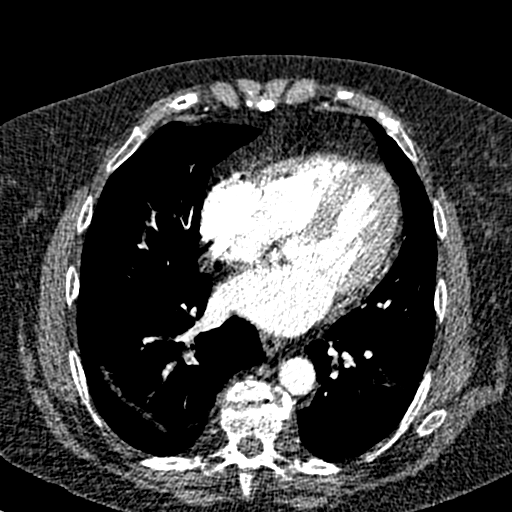
[im 119/274  lung]
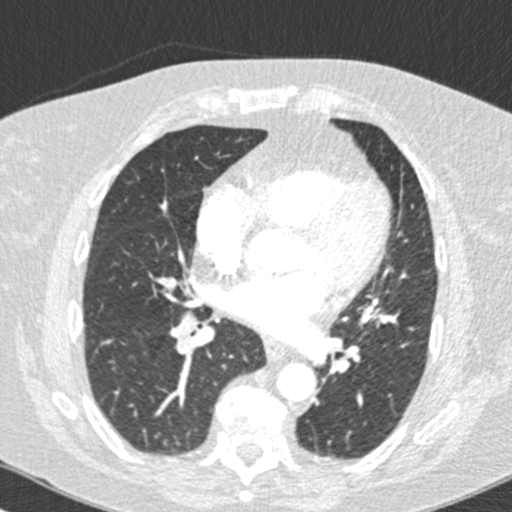
[im 143/274  soft-tissue]
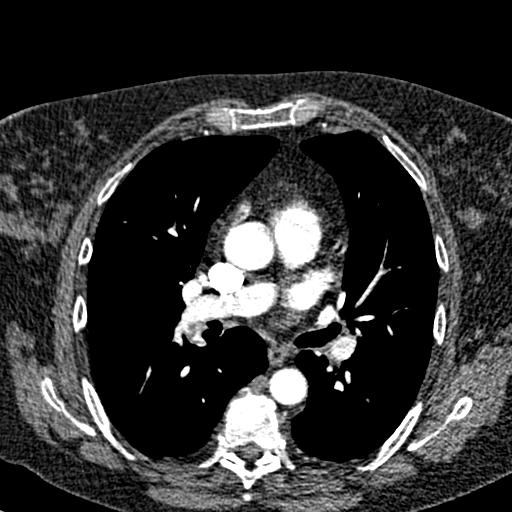
[im 155/274  lung]
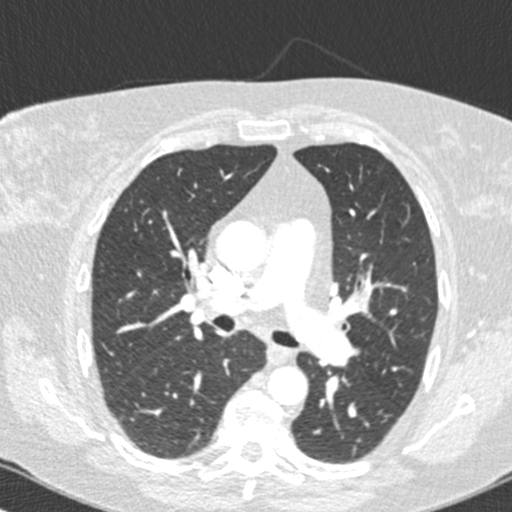
[im 167/274  soft-tissue]
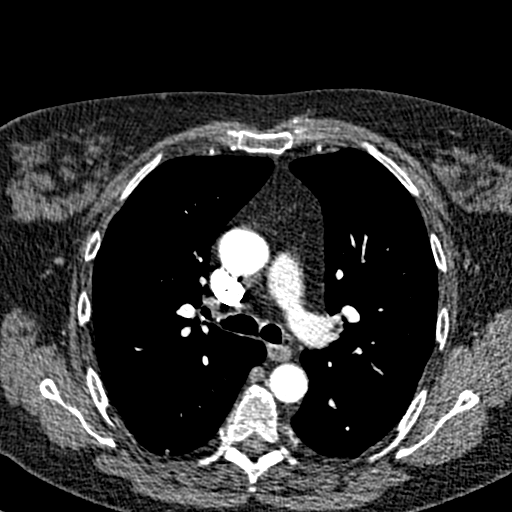
[im 190/274  lung]
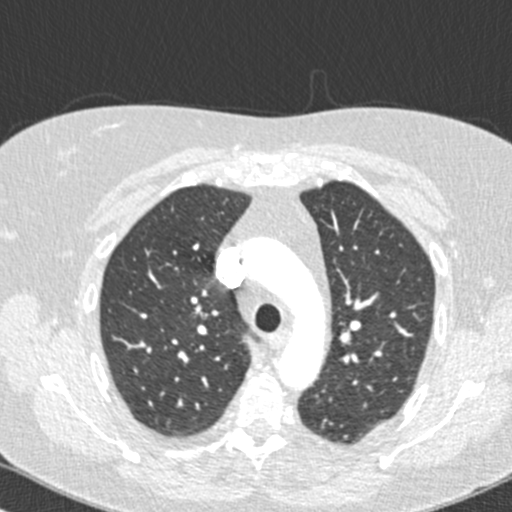
[im 202/274  soft-tissue]
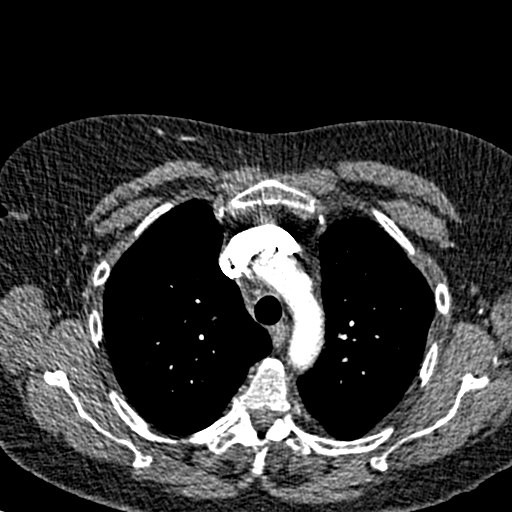
[im 226/274  lung]
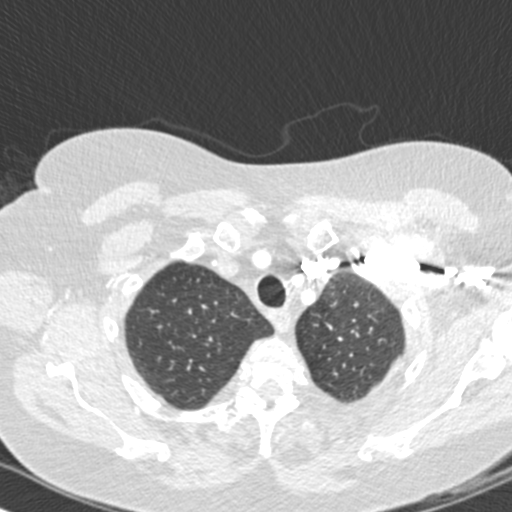
[im 238/274  soft-tissue]
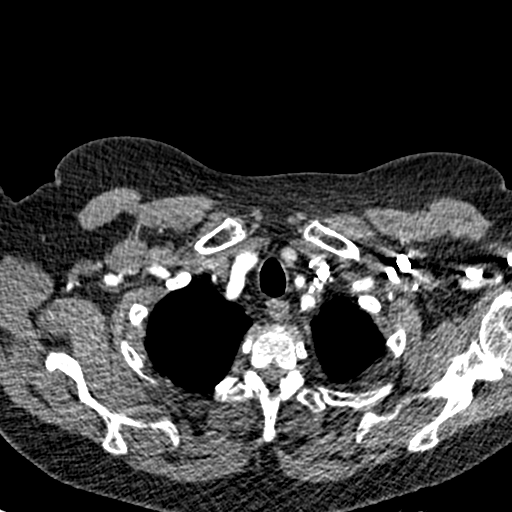
[im 262/274  lung]
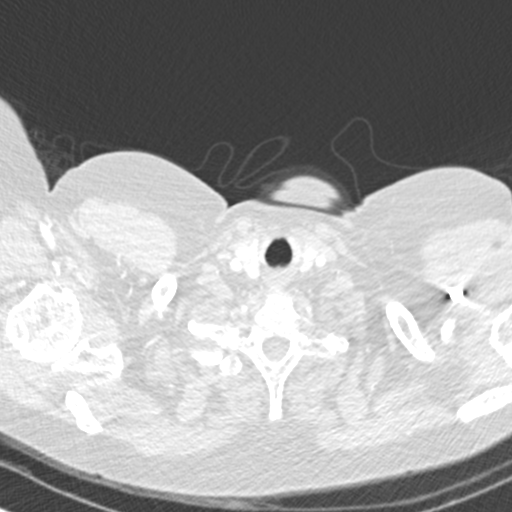

[Series 7: coronal mpr · coronal · 0.59mm/px · 3 of 134 slices shown]
[im 34/134  soft-tissue]
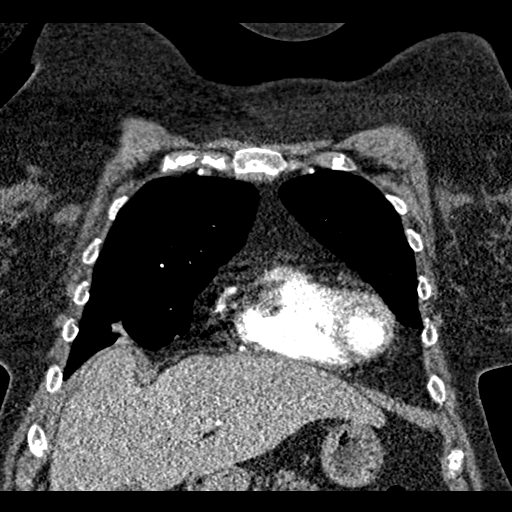
[im 67/134  soft-tissue]
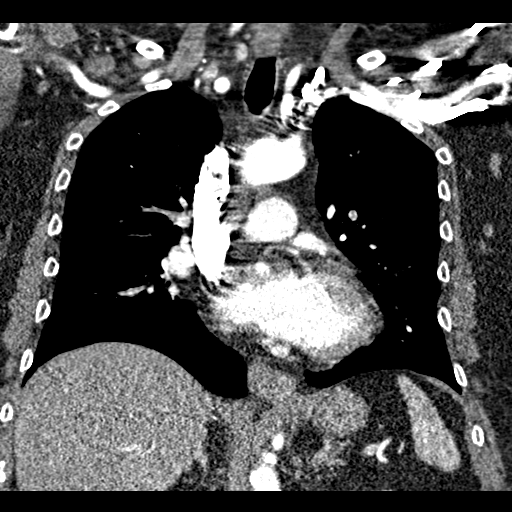
[im 100/134  soft-tissue]
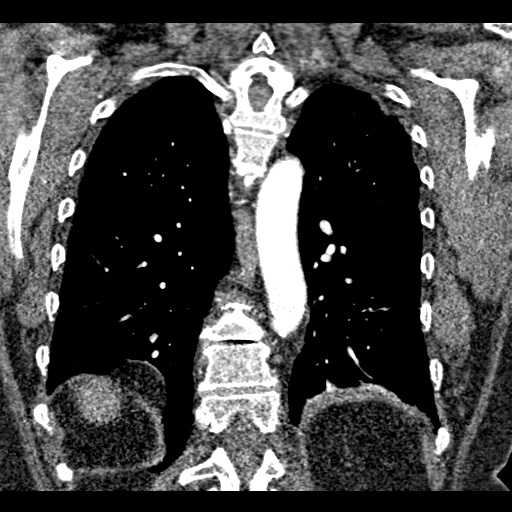

[18 of 46 positions shown; findings below may reference images not displayed]

FINDINGS: Cardiovascular: Satisfactory opacification of pulmonary artery to
the lobar level and poor opacification to segmental level. Positive
for acute pulmonary embolus in segmental arteries of the left upper
lobe, right middle lobe, and the right lower lobe lobar artery.

Positive for acute PE with CTevidence of right heart strain (RV/LV
Ratio = 1.05) consistent with at least submassive (intermediate
risk) PE. The presence of right heart strain has been associated
with an increased risk of morbidity and mortality.

No cardiomegaly. No pericardial effusion. Normal caliber thoracic
aorta.

Mediastinum/Nodes: No enlarged mediastinal, hilar, or axillary lymph
nodes. Thyroid gland, trachea, and esophagus demonstrate no
significant findings.

Lungs/Pleura: Minor bibasilar atelectasis. Right lower lobe superior
segment 5 mm pulmonary nodule (series 6 image 37). Lungs are
otherwise clear. No pleural effusion or pneumothorax.

Upper Abdomen: Small right-sided fat containing Bochdalek hernia.
Small hiatal hernia. No acute process in the field of view.

Musculoskeletal: Chronic appearing moderate T7, mild at T8, and mild
T9 anterior compression deformities with exaggerated thoracic
kyphosis. No significant retropulsion of bony elements or bony
spinal canal stenosis.

Review of the MIP images confirms the above findings.
IMPRESSION: 1. Positive for acute PE with CTevidence of right heart strain
(RV/LV Ratio = 1.05) consistent with at least submassive
(intermediate risk) PE. The presence of right heart strain has been
associated with an increased risk of morbidity and mortality.
2. Chronic appearing moderate T7, mild T8, and mild T9 anterior
compression deformities.
3. Small hiatal hernia.
4. 5 mm right lower lobe superior segment pulmonary nodule. No
follow-up needed if patient is low-risk. Non-contrast chest CT can
be considered in 12 months if patient is high-risk. This
recommendation follows the consensus statement: Guidelines for
Management of Incidental Pulmonary Nodules Detected on CT Images:

By: Mark Gerald Federley M.D.

## 2017-11-11 DIAGNOSIS — Z23 Encounter for immunization: Secondary | ICD-10-CM | POA: Diagnosis not present

## 2017-12-19 DIAGNOSIS — R82998 Other abnormal findings in urine: Secondary | ICD-10-CM | POA: Diagnosis not present

## 2017-12-19 DIAGNOSIS — E782 Mixed hyperlipidemia: Secondary | ICD-10-CM | POA: Diagnosis not present

## 2017-12-19 DIAGNOSIS — M810 Age-related osteoporosis without current pathological fracture: Secondary | ICD-10-CM | POA: Diagnosis not present

## 2017-12-19 DIAGNOSIS — I10 Essential (primary) hypertension: Secondary | ICD-10-CM | POA: Diagnosis not present

## 2017-12-26 DIAGNOSIS — Z1389 Encounter for screening for other disorder: Secondary | ICD-10-CM | POA: Diagnosis not present

## 2017-12-26 DIAGNOSIS — Z0000 Encounter for general adult medical examination without abnormal findings: Secondary | ICD-10-CM | POA: Diagnosis not present

## 2017-12-26 DIAGNOSIS — E782 Mixed hyperlipidemia: Secondary | ICD-10-CM | POA: Diagnosis not present

## 2017-12-26 DIAGNOSIS — J45998 Other asthma: Secondary | ICD-10-CM | POA: Diagnosis not present

## 2017-12-26 DIAGNOSIS — E668 Other obesity: Secondary | ICD-10-CM | POA: Diagnosis not present

## 2017-12-26 DIAGNOSIS — I2699 Other pulmonary embolism without acute cor pulmonale: Secondary | ICD-10-CM | POA: Diagnosis not present

## 2017-12-26 DIAGNOSIS — I10 Essential (primary) hypertension: Secondary | ICD-10-CM | POA: Diagnosis not present

## 2017-12-26 DIAGNOSIS — N183 Chronic kidney disease, stage 3 (moderate): Secondary | ICD-10-CM | POA: Diagnosis not present

## 2017-12-26 DIAGNOSIS — G4733 Obstructive sleep apnea (adult) (pediatric): Secondary | ICD-10-CM | POA: Diagnosis not present

## 2017-12-26 DIAGNOSIS — Z6832 Body mass index (BMI) 32.0-32.9, adult: Secondary | ICD-10-CM | POA: Diagnosis not present

## 2017-12-26 DIAGNOSIS — Z1212 Encounter for screening for malignant neoplasm of rectum: Secondary | ICD-10-CM | POA: Diagnosis not present

## 2017-12-26 DIAGNOSIS — M810 Age-related osteoporosis without current pathological fracture: Secondary | ICD-10-CM | POA: Diagnosis not present

## 2017-12-26 DIAGNOSIS — R195 Other fecal abnormalities: Secondary | ICD-10-CM | POA: Diagnosis not present

## 2018-01-06 DIAGNOSIS — H04123 Dry eye syndrome of bilateral lacrimal glands: Secondary | ICD-10-CM | POA: Diagnosis not present

## 2018-01-06 DIAGNOSIS — Z961 Presence of intraocular lens: Secondary | ICD-10-CM | POA: Diagnosis not present

## 2018-01-06 DIAGNOSIS — H43811 Vitreous degeneration, right eye: Secondary | ICD-10-CM | POA: Diagnosis not present

## 2018-01-06 DIAGNOSIS — H353131 Nonexudative age-related macular degeneration, bilateral, early dry stage: Secondary | ICD-10-CM | POA: Diagnosis not present

## 2018-10-22 DIAGNOSIS — Z23 Encounter for immunization: Secondary | ICD-10-CM | POA: Diagnosis not present

## 2018-11-27 DIAGNOSIS — Z1211 Encounter for screening for malignant neoplasm of colon: Secondary | ICD-10-CM | POA: Diagnosis not present

## 2018-11-27 DIAGNOSIS — R198 Other specified symptoms and signs involving the digestive system and abdomen: Secondary | ICD-10-CM | POA: Diagnosis not present

## 2019-01-04 DIAGNOSIS — R198 Other specified symptoms and signs involving the digestive system and abdomen: Secondary | ICD-10-CM | POA: Diagnosis not present

## 2019-01-04 DIAGNOSIS — R195 Other fecal abnormalities: Secondary | ICD-10-CM | POA: Diagnosis not present

## 2019-01-13 DIAGNOSIS — E782 Mixed hyperlipidemia: Secondary | ICD-10-CM | POA: Diagnosis not present

## 2019-01-13 DIAGNOSIS — M810 Age-related osteoporosis without current pathological fracture: Secondary | ICD-10-CM | POA: Diagnosis not present

## 2019-01-18 DIAGNOSIS — Z1159 Encounter for screening for other viral diseases: Secondary | ICD-10-CM | POA: Diagnosis not present

## 2019-01-21 DIAGNOSIS — D125 Benign neoplasm of sigmoid colon: Secondary | ICD-10-CM | POA: Diagnosis not present

## 2019-01-21 DIAGNOSIS — D120 Benign neoplasm of cecum: Secondary | ICD-10-CM | POA: Diagnosis not present

## 2019-01-21 DIAGNOSIS — R195 Other fecal abnormalities: Secondary | ICD-10-CM | POA: Diagnosis not present

## 2019-01-21 DIAGNOSIS — D122 Benign neoplasm of ascending colon: Secondary | ICD-10-CM | POA: Diagnosis not present

## 2019-01-25 DIAGNOSIS — R82998 Other abnormal findings in urine: Secondary | ICD-10-CM | POA: Diagnosis not present

## 2019-01-26 DIAGNOSIS — D125 Benign neoplasm of sigmoid colon: Secondary | ICD-10-CM | POA: Diagnosis not present

## 2019-01-26 DIAGNOSIS — D120 Benign neoplasm of cecum: Secondary | ICD-10-CM | POA: Diagnosis not present

## 2019-01-26 DIAGNOSIS — D122 Benign neoplasm of ascending colon: Secondary | ICD-10-CM | POA: Diagnosis not present

## 2019-03-26 DIAGNOSIS — Z23 Encounter for immunization: Secondary | ICD-10-CM | POA: Diagnosis not present

## 2019-04-23 DIAGNOSIS — Z23 Encounter for immunization: Secondary | ICD-10-CM | POA: Diagnosis not present

## 2019-11-02 DIAGNOSIS — H26493 Other secondary cataract, bilateral: Secondary | ICD-10-CM | POA: Diagnosis not present

## 2019-11-02 DIAGNOSIS — H353131 Nonexudative age-related macular degeneration, bilateral, early dry stage: Secondary | ICD-10-CM | POA: Diagnosis not present

## 2019-11-02 DIAGNOSIS — H04123 Dry eye syndrome of bilateral lacrimal glands: Secondary | ICD-10-CM | POA: Diagnosis not present

## 2019-11-02 DIAGNOSIS — Z961 Presence of intraocular lens: Secondary | ICD-10-CM | POA: Diagnosis not present

## 2019-11-13 DIAGNOSIS — Z23 Encounter for immunization: Secondary | ICD-10-CM | POA: Diagnosis not present

## 2019-12-15 DIAGNOSIS — Z23 Encounter for immunization: Secondary | ICD-10-CM | POA: Diagnosis not present

## 2020-01-19 DIAGNOSIS — Z86010 Personal history of colonic polyps: Secondary | ICD-10-CM | POA: Diagnosis not present

## 2020-01-19 DIAGNOSIS — R198 Other specified symptoms and signs involving the digestive system and abdomen: Secondary | ICD-10-CM | POA: Diagnosis not present

## 2020-01-19 DIAGNOSIS — K5901 Slow transit constipation: Secondary | ICD-10-CM | POA: Diagnosis not present

## 2020-02-18 DIAGNOSIS — N1832 Chronic kidney disease, stage 3b: Secondary | ICD-10-CM | POA: Diagnosis not present

## 2020-02-18 DIAGNOSIS — J309 Allergic rhinitis, unspecified: Secondary | ICD-10-CM | POA: Diagnosis not present

## 2020-02-18 DIAGNOSIS — D126 Benign neoplasm of colon, unspecified: Secondary | ICD-10-CM | POA: Diagnosis not present

## 2020-02-18 DIAGNOSIS — E782 Mixed hyperlipidemia: Secondary | ICD-10-CM | POA: Diagnosis not present

## 2020-02-18 DIAGNOSIS — S22080S Wedge compression fracture of T11-T12 vertebra, sequela: Secondary | ICD-10-CM | POA: Diagnosis not present

## 2020-02-18 DIAGNOSIS — E669 Obesity, unspecified: Secondary | ICD-10-CM | POA: Diagnosis not present

## 2020-02-18 DIAGNOSIS — M810 Age-related osteoporosis without current pathological fracture: Secondary | ICD-10-CM | POA: Diagnosis not present

## 2020-02-18 DIAGNOSIS — Z0000 Encounter for general adult medical examination without abnormal findings: Secondary | ICD-10-CM | POA: Diagnosis not present

## 2020-02-18 DIAGNOSIS — J45909 Unspecified asthma, uncomplicated: Secondary | ICD-10-CM | POA: Diagnosis not present

## 2020-02-18 DIAGNOSIS — I10 Essential (primary) hypertension: Secondary | ICD-10-CM | POA: Diagnosis not present

## 2020-02-18 DIAGNOSIS — I2699 Other pulmonary embolism without acute cor pulmonale: Secondary | ICD-10-CM | POA: Diagnosis not present

## 2020-02-18 DIAGNOSIS — R82998 Other abnormal findings in urine: Secondary | ICD-10-CM | POA: Diagnosis not present

## 2020-02-18 DIAGNOSIS — S32010S Wedge compression fracture of first lumbar vertebra, sequela: Secondary | ICD-10-CM | POA: Diagnosis not present

## 2020-02-29 DIAGNOSIS — Z1212 Encounter for screening for malignant neoplasm of rectum: Secondary | ICD-10-CM | POA: Diagnosis not present

## 2020-06-23 DIAGNOSIS — Z23 Encounter for immunization: Secondary | ICD-10-CM | POA: Diagnosis not present

## 2020-11-02 DIAGNOSIS — Z961 Presence of intraocular lens: Secondary | ICD-10-CM | POA: Diagnosis not present

## 2020-11-02 DIAGNOSIS — H353131 Nonexudative age-related macular degeneration, bilateral, early dry stage: Secondary | ICD-10-CM | POA: Diagnosis not present

## 2020-11-02 DIAGNOSIS — H26493 Other secondary cataract, bilateral: Secondary | ICD-10-CM | POA: Diagnosis not present

## 2020-11-02 DIAGNOSIS — H35033 Hypertensive retinopathy, bilateral: Secondary | ICD-10-CM | POA: Diagnosis not present

## 2020-11-18 DIAGNOSIS — Z23 Encounter for immunization: Secondary | ICD-10-CM | POA: Diagnosis not present

## 2020-11-20 DIAGNOSIS — Z23 Encounter for immunization: Secondary | ICD-10-CM | POA: Diagnosis not present

## 2021-02-14 DIAGNOSIS — E782 Mixed hyperlipidemia: Secondary | ICD-10-CM | POA: Diagnosis not present

## 2021-02-14 DIAGNOSIS — I10 Essential (primary) hypertension: Secondary | ICD-10-CM | POA: Diagnosis not present

## 2021-02-21 DIAGNOSIS — E669 Obesity, unspecified: Secondary | ICD-10-CM | POA: Diagnosis not present

## 2021-02-21 DIAGNOSIS — J45909 Unspecified asthma, uncomplicated: Secondary | ICD-10-CM | POA: Diagnosis not present

## 2021-02-21 DIAGNOSIS — I129 Hypertensive chronic kidney disease with stage 1 through stage 4 chronic kidney disease, or unspecified chronic kidney disease: Secondary | ICD-10-CM | POA: Diagnosis not present

## 2021-02-21 DIAGNOSIS — E782 Mixed hyperlipidemia: Secondary | ICD-10-CM | POA: Diagnosis not present

## 2021-02-21 DIAGNOSIS — E559 Vitamin D deficiency, unspecified: Secondary | ICD-10-CM | POA: Diagnosis not present

## 2021-02-21 DIAGNOSIS — S32010S Wedge compression fracture of first lumbar vertebra, sequela: Secondary | ICD-10-CM | POA: Diagnosis not present

## 2021-02-21 DIAGNOSIS — N1832 Chronic kidney disease, stage 3b: Secondary | ICD-10-CM | POA: Diagnosis not present

## 2021-02-21 DIAGNOSIS — R011 Cardiac murmur, unspecified: Secondary | ICD-10-CM | POA: Diagnosis not present

## 2021-02-21 DIAGNOSIS — S22080S Wedge compression fracture of T11-T12 vertebra, sequela: Secondary | ICD-10-CM | POA: Diagnosis not present

## 2021-02-21 DIAGNOSIS — D126 Benign neoplasm of colon, unspecified: Secondary | ICD-10-CM | POA: Diagnosis not present

## 2021-02-21 DIAGNOSIS — R82998 Other abnormal findings in urine: Secondary | ICD-10-CM | POA: Diagnosis not present

## 2021-02-21 DIAGNOSIS — Z0000 Encounter for general adult medical examination without abnormal findings: Secondary | ICD-10-CM | POA: Diagnosis not present

## 2021-02-21 DIAGNOSIS — I2699 Other pulmonary embolism without acute cor pulmonale: Secondary | ICD-10-CM | POA: Diagnosis not present

## 2021-02-21 DIAGNOSIS — M810 Age-related osteoporosis without current pathological fracture: Secondary | ICD-10-CM | POA: Diagnosis not present

## 2021-02-21 DIAGNOSIS — I10 Essential (primary) hypertension: Secondary | ICD-10-CM | POA: Diagnosis not present

## 2021-02-22 ENCOUNTER — Other Ambulatory Visit (HOSPITAL_COMMUNITY): Payer: Self-pay | Admitting: Internal Medicine

## 2021-02-22 DIAGNOSIS — R011 Cardiac murmur, unspecified: Secondary | ICD-10-CM

## 2021-03-07 ENCOUNTER — Ambulatory Visit (HOSPITAL_COMMUNITY)
Admission: RE | Admit: 2021-03-07 | Discharge: 2021-03-07 | Disposition: A | Discharge status: Home / Self Care | Payer: Medicare Other | Source: Ambulatory Visit | Attending: Internal Medicine | Admitting: Internal Medicine

## 2021-03-07 ENCOUNTER — Other Ambulatory Visit: Payer: Self-pay

## 2021-03-07 DIAGNOSIS — E785 Hyperlipidemia, unspecified: Secondary | ICD-10-CM | POA: Insufficient documentation

## 2021-03-07 DIAGNOSIS — I2510 Atherosclerotic heart disease of native coronary artery without angina pectoris: Secondary | ICD-10-CM | POA: Insufficient documentation

## 2021-03-07 DIAGNOSIS — I10 Essential (primary) hypertension: Secondary | ICD-10-CM | POA: Diagnosis not present

## 2021-03-07 DIAGNOSIS — R011 Cardiac murmur, unspecified: Secondary | ICD-10-CM | POA: Diagnosis not present

## 2021-03-07 LAB — ECHOCARDIOGRAM COMPLETE
Area-P 1/2: 4.17 cm2
Calc EF: 61.7 %
S' Lateral: 3.5 cm
Single Plane A2C EF: 62.1 %
Single Plane A4C EF: 61.6 %

## 2021-10-31 DIAGNOSIS — Z23 Encounter for immunization: Secondary | ICD-10-CM | POA: Diagnosis not present

## 2021-11-24 DIAGNOSIS — Z23 Encounter for immunization: Secondary | ICD-10-CM | POA: Diagnosis not present

## 2022-02-27 DIAGNOSIS — E782 Mixed hyperlipidemia: Secondary | ICD-10-CM | POA: Diagnosis not present

## 2022-02-27 DIAGNOSIS — I10 Essential (primary) hypertension: Secondary | ICD-10-CM | POA: Diagnosis not present

## 2022-02-27 DIAGNOSIS — M810 Age-related osteoporosis without current pathological fracture: Secondary | ICD-10-CM | POA: Diagnosis not present

## 2022-02-27 DIAGNOSIS — R7989 Other specified abnormal findings of blood chemistry: Secondary | ICD-10-CM | POA: Diagnosis not present

## 2022-03-06 DIAGNOSIS — J45909 Unspecified asthma, uncomplicated: Secondary | ICD-10-CM | POA: Diagnosis not present

## 2022-03-06 DIAGNOSIS — I2699 Other pulmonary embolism without acute cor pulmonale: Secondary | ICD-10-CM | POA: Diagnosis not present

## 2022-03-06 DIAGNOSIS — M810 Age-related osteoporosis without current pathological fracture: Secondary | ICD-10-CM | POA: Diagnosis not present

## 2022-03-06 DIAGNOSIS — M1990 Unspecified osteoarthritis, unspecified site: Secondary | ICD-10-CM | POA: Diagnosis not present

## 2022-03-06 DIAGNOSIS — E782 Mixed hyperlipidemia: Secondary | ICD-10-CM | POA: Diagnosis not present

## 2022-03-06 DIAGNOSIS — J309 Allergic rhinitis, unspecified: Secondary | ICD-10-CM | POA: Diagnosis not present

## 2022-03-06 DIAGNOSIS — R82998 Other abnormal findings in urine: Secondary | ICD-10-CM | POA: Diagnosis not present

## 2022-03-06 DIAGNOSIS — Z23 Encounter for immunization: Secondary | ICD-10-CM | POA: Diagnosis not present

## 2022-03-06 DIAGNOSIS — N1832 Chronic kidney disease, stage 3b: Secondary | ICD-10-CM | POA: Diagnosis not present

## 2022-03-06 DIAGNOSIS — Z1212 Encounter for screening for malignant neoplasm of rectum: Secondary | ICD-10-CM | POA: Diagnosis not present

## 2022-03-06 DIAGNOSIS — Z0000 Encounter for general adult medical examination without abnormal findings: Secondary | ICD-10-CM | POA: Diagnosis not present

## 2022-03-06 DIAGNOSIS — I129 Hypertensive chronic kidney disease with stage 1 through stage 4 chronic kidney disease, or unspecified chronic kidney disease: Secondary | ICD-10-CM | POA: Diagnosis not present

## 2022-03-06 DIAGNOSIS — E669 Obesity, unspecified: Secondary | ICD-10-CM | POA: Diagnosis not present

## 2022-03-06 DIAGNOSIS — S22080S Wedge compression fracture of T11-T12 vertebra, sequela: Secondary | ICD-10-CM | POA: Diagnosis not present

## 2022-10-18 DIAGNOSIS — Z23 Encounter for immunization: Secondary | ICD-10-CM | POA: Diagnosis not present

## 2022-11-30 DIAGNOSIS — Z23 Encounter for immunization: Secondary | ICD-10-CM | POA: Diagnosis not present

## 2023-03-05 DIAGNOSIS — Z1212 Encounter for screening for malignant neoplasm of rectum: Secondary | ICD-10-CM | POA: Diagnosis not present

## 2023-03-05 DIAGNOSIS — M810 Age-related osteoporosis without current pathological fracture: Secondary | ICD-10-CM | POA: Diagnosis not present

## 2023-03-05 DIAGNOSIS — I129 Hypertensive chronic kidney disease with stage 1 through stage 4 chronic kidney disease, or unspecified chronic kidney disease: Secondary | ICD-10-CM | POA: Diagnosis not present

## 2023-03-05 DIAGNOSIS — N1832 Chronic kidney disease, stage 3b: Secondary | ICD-10-CM | POA: Diagnosis not present

## 2023-03-05 DIAGNOSIS — E785 Hyperlipidemia, unspecified: Secondary | ICD-10-CM | POA: Diagnosis not present

## 2023-03-05 DIAGNOSIS — E782 Mixed hyperlipidemia: Secondary | ICD-10-CM | POA: Diagnosis not present

## 2023-03-12 DIAGNOSIS — I129 Hypertensive chronic kidney disease with stage 1 through stage 4 chronic kidney disease, or unspecified chronic kidney disease: Secondary | ICD-10-CM | POA: Diagnosis not present

## 2023-03-12 DIAGNOSIS — E039 Hypothyroidism, unspecified: Secondary | ICD-10-CM | POA: Diagnosis not present

## 2023-03-12 DIAGNOSIS — S22080S Wedge compression fracture of T11-T12 vertebra, sequela: Secondary | ICD-10-CM | POA: Diagnosis not present

## 2023-03-12 DIAGNOSIS — Z1339 Encounter for screening examination for other mental health and behavioral disorders: Secondary | ICD-10-CM | POA: Diagnosis not present

## 2023-03-12 DIAGNOSIS — Z0000 Encounter for general adult medical examination without abnormal findings: Secondary | ICD-10-CM | POA: Diagnosis not present

## 2023-03-12 DIAGNOSIS — G4733 Obstructive sleep apnea (adult) (pediatric): Secondary | ICD-10-CM | POA: Diagnosis not present

## 2023-03-12 DIAGNOSIS — M1990 Unspecified osteoarthritis, unspecified site: Secondary | ICD-10-CM | POA: Diagnosis not present

## 2023-03-12 DIAGNOSIS — J45909 Unspecified asthma, uncomplicated: Secondary | ICD-10-CM | POA: Diagnosis not present

## 2023-03-12 DIAGNOSIS — R82998 Other abnormal findings in urine: Secondary | ICD-10-CM | POA: Diagnosis not present

## 2023-03-12 DIAGNOSIS — Z1331 Encounter for screening for depression: Secondary | ICD-10-CM | POA: Diagnosis not present

## 2023-03-12 DIAGNOSIS — I10 Essential (primary) hypertension: Secondary | ICD-10-CM | POA: Diagnosis not present

## 2023-03-12 DIAGNOSIS — N1832 Chronic kidney disease, stage 3b: Secondary | ICD-10-CM | POA: Diagnosis not present

## 2023-03-12 DIAGNOSIS — I4891 Unspecified atrial fibrillation: Secondary | ICD-10-CM | POA: Diagnosis not present

## 2023-03-12 DIAGNOSIS — E782 Mixed hyperlipidemia: Secondary | ICD-10-CM | POA: Diagnosis not present

## 2023-03-12 DIAGNOSIS — I2699 Other pulmonary embolism without acute cor pulmonale: Secondary | ICD-10-CM | POA: Diagnosis not present

## 2023-03-12 DIAGNOSIS — E669 Obesity, unspecified: Secondary | ICD-10-CM | POA: Diagnosis not present

## 2023-03-12 DIAGNOSIS — S32010S Wedge compression fracture of first lumbar vertebra, sequela: Secondary | ICD-10-CM | POA: Diagnosis not present

## 2023-03-19 ENCOUNTER — Ambulatory Visit (HOSPITAL_COMMUNITY)
Admission: RE | Admit: 2023-03-19 | Discharge: 2023-03-19 | Disposition: A | Discharge status: Home / Self Care | Payer: Medicare Other | Source: Ambulatory Visit | Attending: Internal Medicine | Admitting: Internal Medicine

## 2023-03-19 ENCOUNTER — Encounter (HOSPITAL_COMMUNITY): Payer: Self-pay | Admitting: Internal Medicine

## 2023-03-19 VITALS — BP 128/98 | HR 120 | Ht 67.0 in | Wt 217.0 lb

## 2023-03-19 DIAGNOSIS — D6869 Other thrombophilia: Secondary | ICD-10-CM | POA: Insufficient documentation

## 2023-03-19 DIAGNOSIS — I4819 Other persistent atrial fibrillation: Secondary | ICD-10-CM | POA: Insufficient documentation

## 2023-03-19 DIAGNOSIS — I4891 Unspecified atrial fibrillation: Secondary | ICD-10-CM | POA: Insufficient documentation

## 2023-03-19 DIAGNOSIS — I129 Hypertensive chronic kidney disease with stage 1 through stage 4 chronic kidney disease, or unspecified chronic kidney disease: Secondary | ICD-10-CM | POA: Insufficient documentation

## 2023-03-19 DIAGNOSIS — G4733 Obstructive sleep apnea (adult) (pediatric): Secondary | ICD-10-CM | POA: Insufficient documentation

## 2023-03-19 DIAGNOSIS — N189 Chronic kidney disease, unspecified: Secondary | ICD-10-CM | POA: Insufficient documentation

## 2023-03-19 DIAGNOSIS — E785 Hyperlipidemia, unspecified: Secondary | ICD-10-CM | POA: Diagnosis not present

## 2023-03-19 LAB — CBC
HCT: 40.4 % (ref 36.0–46.0)
Hemoglobin: 13.3 g/dL (ref 12.0–15.0)
MCH: 32.1 pg (ref 26.0–34.0)
MCHC: 32.9 g/dL (ref 30.0–36.0)
MCV: 97.6 fL (ref 80.0–100.0)
Platelets: 213 10*3/uL (ref 150–400)
RBC: 4.14 MIL/uL (ref 3.87–5.11)
RDW: 14.1 % (ref 11.5–15.5)
WBC: 7.2 10*3/uL (ref 4.0–10.5)
nRBC: 0 % (ref 0.0–0.2)

## 2023-03-19 LAB — BASIC METABOLIC PANEL
Anion gap: 9 (ref 5–15)
BUN: 23 mg/dL (ref 8–23)
CO2: 22 mmol/L (ref 22–32)
Calcium: 9.3 mg/dL (ref 8.9–10.3)
Chloride: 108 mmol/L (ref 98–111)
Creatinine, Ser: 1.36 mg/dL — ABNORMAL HIGH (ref 0.44–1.00)
GFR, Estimated: 41 mL/min — ABNORMAL LOW (ref >60)
Glucose, Bld: 96 mg/dL (ref 70–99)
Potassium: 4.4 mmol/L (ref 3.5–5.1)
Sodium: 139 mmol/L (ref 135–145)

## 2023-03-19 MED ORDER — DILTIAZEM HCL ER COATED BEADS 180 MG PO CP24: 180.0000 mg | ORAL_CAPSULE | Freq: Every day | ORAL | 11 refills | Status: DC

## 2023-03-19 NOTE — Patient Instructions (Signed)
 Procedure called cardioversion.   Kardiamobile device for monitoring rhythm.

## 2023-03-19 NOTE — H&P (View-Only) (Signed)
 Primary Care Physician: Janey Santos, MD Primary Cardiologist: None Electrophysiologist: None     Referring Physician: Dr. Janey Rock [name] is a 73 y.o. female with a history of OSA not on CPAP, history of PE/DVT in 2017, hypothyroidism, obesity, HLD, CKD, HTN, and paroxysmal atrial fibrillation who presents for consultation in the [facility]. Seen by PCP on 03/12/23 noted to be in Afib with RVR. Already on Eliquis 5 mg BID. Started on diltiazem  120 mg daily. Patient is on Eliquis 5 mg BID for a CHADS2VASC score of 3.  On evaluation today, she is currently in Afib with RVR. Patient has been taking diltiazem  120 mg daily for the past few days. She refused in the past to use any device for her CPAP. She has been on Eliquis 5 mg BID due to history of PE/DVT. No missed doses of Eliquis.   Today, she denies symptoms of palpitations, chest pain, shortness of breath, orthopnea, PND, lower extremity edema, dizziness, presyncope, syncope, snoring, daytime somnolence, bleeding, or neurologic sequela. The patient is tolerating medications without difficulties and is otherwise without complaint today.    Atrial Fibrillation Risk Factors:  she does have symptoms or diagnosis of sleep apnea. she is not compliant with CPAP therapy.  she has a BMI of Body mass index is 33.99 kg/m.[name] Filed Weights   03/19/23 1434  Weight: 98.4 kg    Current Outpatient Medications  Medication Sig Dispense Refill   albuterol (VENTOLIN HFA) 108 (90 Base) MCG/ACT inhaler Inhale 1-2 puffs into the lungs every 6 (six) hours as needed for shortness of breath or wheezing.     alendronate (FOSAMAX) 70 MG tablet Take 70 mg by mouth once a week.     apixaban (ELIQUIS) 5 MG TABS tablet Take 5 mg by mouth 2 (two) times daily.     aspirin EC 325 MG tablet Take 325 mg by mouth daily.     B Complex Vitamins (B COMPLEX 100 PO) Take one tablet daily Oral     cetirizine (ZYRTEC ALLERGY) 10  MG tablet Take 10 mg by mouth daily.     Coenzyme Q10 (CO Q 10) 100 MG CAPS Take one capsule daily Oral     diltiazem  (CARDIZEM  CD) 180 MG 24 hr capsule Take 1 capsule (180 mg total) by mouth daily. 30 capsule 11   fluticasone (FLONASE) 50 MCG/ACT nasal spray Place 2 sprays into both nostrils daily.     Fluticasone-Salmeterol (ADVAIR) 250-50 MCG/DOSE AEPB Inhale 1 puff into the lungs 2 (two) times daily.     losartan (COZAAR) 50 MG tablet Take 50 mg by mouth daily.     rosuvastatin (CRESTOR) 5 MG tablet Take 5 mg by mouth daily.     Vitamin D, Ergocalciferol, (DRISDOL) 50000 UNITS CAPS capsule Take 50,000 Units by mouth every 7 (seven) days.     Vitamin E 100 units TABS Take 2 capsules in the morning and 2 capsules in the evening (180units each)     No current facility-administered medications for this encounter.    Atrial Fibrillation Management history:  Previous antiarrhythmic drugs: none Previous cardioversions: none Previous ablations: none Anticoagulation history: Eliquis   ROS- All systems are reviewed and negative except as per the HPI above.  Physical Exam: BP (!) 128/98   Pulse (!) 120   Ht 5' 7 (1.702 m)   Wt 98.4 kg   BMI 33.99 kg/m   GEN: Well nourished, well developed in no  acute distress NECK: No JVD; No carotid bruits CARDIAC: Irregularly irregular tachycardic rate and rhythm, no murmurs, rubs, gallops RESPIRATORY:  Clear to auscultation without rales, wheezing or rhonchi  ABDOMEN: Soft, non-tender, non-distended EXTREMITIES:  No edema; No deformity   EKG today demonstrates  Vent. rate 120 BPM PR interval * ms QRS duration 82 ms QT/QTcB 310/438 ms P-R-T axes * 58 3 Atrial fibrillation with rapid ventricular response Low voltage QRS Abnormal ECG No previous ECGs available  Echo 03/07/21 demonstrated  1. Left ventricular ejection fraction, by estimation, is 60 to 65%. The  left ventricle has normal function. The left ventricle has no regional  wall  motion abnormalities. Left ventricular diastolic parameters are  consistent with Grade I diastolic  dysfunction (impaired relaxation).   2. Right ventricular systolic function is normal. The right ventricular  size is normal. There is normal pulmonary artery systolic pressure. The  estimated right ventricular systolic pressure is 25.8 mmHg.   3. The mitral valve is normal in structure. Trivial mitral valve  regurgitation.   4. The aortic valve is tricuspid. There is mild calcification of the  aortic valve. There is mild thickening of the aortic valve. Aortic valve  regurgitation is not visualized. Aortic valve sclerosis is present, with  no evidence of aortic valve stenosis.   5. Aortic dilatation noted. There is mild dilatation of the ascending  aorta, measuring 38 mm.   6. The inferior vena cava is normal in size with greater than 50%  respiratory variability, suggesting right atrial pressure of 3 mmHg.    ASSESSMENT & PLAN CHA2DS2-VASc Score = 3  The patient's score is based upon: CHF History: 0 HTN History: 1 Diabetes History: 0 Stroke History: 0 Vascular Disease History: 0 Age Score: 1 Gender Score: 1       ASSESSMENT AND PLAN: Persistent Atrial Fibrillation (ICD10:  I48.19) The patient's CHA2DS2-VASc score is 3, indicating a 3.2% annual risk of stroke.    She is in Afib with RVR. Education provided about Afib. Discussion about medication treatments and ablation going forward if there is ERAF after cardioversion. After discussion, patient agrees would like to try to convert to NSR. She is in agreement with proceeding with cardioversion. She has to secure a ride for the day of cardioversion and will contact us  back to schedule cardioversion. Labs drawn today. Diltiazem  increased to 180 mg daily for improved rate control. Rhythm monitoring device recommended.   Informed Consent   Shared Decision Making/Informed Consent The risks (stroke, cardiac arrhythmias rarely resulting  in the need for a temporary or permanent pacemaker, skin irritation or burns and complications associated with conscious sedation including aspiration, arrhythmia, respiratory failure and death), benefits (restoration of normal sinus rhythm) and alternatives of a direct current cardioversion were explained in detail to Ms. Stai and she agrees to proceed.      Secondary Hypercoagulable State (ICD10:  D68.69) The patient is at significant risk for stroke/thromboembolism based upon her CHA2DS2-VASc Score of 3.  Continue Apixaban (Eliquis).  She has been on Eliquis before Afib diagnosis; no missed doses.     Patient will call clinic to schedule cardioversion.    Terra Pac, PA-C  Afib Clinic [facility] [address] [place], [place] [address] [phone]

## 2023-03-19 NOTE — Progress Notes (Signed)
 Primary Care Physician: Chilton Greathouse, MD Primary Cardiologist: None Electrophysiologist: None     Referring Physician: Dr. Myrtie Cruise is a 73 y.o. female with a history of OSA not on CPAP, history of PE/DVT in 2017, hypothyroidism, obesity, HLD, CKD, HTN, and paroxysmal atrial fibrillation who presents for consultation in the [facility]. Seen by PCP on 03/12/23 noted to be in Afib with RVR. Already on Eliquis 5 mg BID. Started on diltiazem 120 mg daily. Patient is on Eliquis 5 mg BID for a CHADS2VASC score of 3.  On evaluation today, she is currently in Afib with RVR. Patient has been taking diltiazem 120 mg daily for the past few days. She refused in the past to use any device for her CPAP. She has been on Eliquis 5 mg BID due to history of PE/DVT. No missed doses of Eliquis.   Today, she denies symptoms of palpitations, chest pain, shortness of breath, orthopnea, PND, lower extremity edema, dizziness, presyncope, syncope, snoring, daytime somnolence, bleeding, or neurologic sequela. The patient is tolerating medications without difficulties and is otherwise without complaint today.    Atrial Fibrillation Risk Factors:  she does have symptoms or diagnosis of sleep apnea. she is not compliant with CPAP therapy.  she has a BMI of Body mass index is 33.99 kg/m.Marland Kitchen Filed Weights   03/19/23 1434  Weight: 98.4 kg    Current Outpatient Medications  Medication Sig Dispense Refill   albuterol (VENTOLIN HFA) 108 (90 Base) MCG/ACT inhaler Inhale 1-2 puffs into the lungs every 6 (six) hours as needed for shortness of breath or wheezing.     alendronate (FOSAMAX) 70 MG tablet Take 70 mg by mouth once a week.     apixaban (ELIQUIS) 5 MG TABS tablet Take 5 mg by mouth 2 (two) times daily.     aspirin EC 325 MG tablet Take 325 mg by mouth daily.     B Complex Vitamins (B COMPLEX 100 PO) Take one tablet daily Oral     cetirizine (ZYRTEC ALLERGY) 10  MG tablet Take 10 mg by mouth daily.     Coenzyme Q10 (CO Q 10) 100 MG CAPS Take one capsule daily Oral     diltiazem (CARDIZEM CD) 180 MG 24 hr capsule Take 1 capsule (180 mg total) by mouth daily. 30 capsule 11   fluticasone (FLONASE) 50 MCG/ACT nasal spray Place 2 sprays into both nostrils daily.     Fluticasone-Salmeterol (ADVAIR) 250-50 MCG/DOSE AEPB Inhale 1 puff into the lungs 2 (two) times daily.     losartan (COZAAR) 50 MG tablet Take 50 mg by mouth daily.     rosuvastatin (CRESTOR) 5 MG tablet Take 5 mg by mouth daily.     Vitamin D, Ergocalciferol, (DRISDOL) 50000 UNITS CAPS capsule Take 50,000 Units by mouth every 7 (seven) days.     Vitamin E 100 units TABS Take 2 capsules in the morning and 2 capsules in the evening (180units each)     No current facility-administered medications for this encounter.    Atrial Fibrillation Management history:  Previous antiarrhythmic drugs: none Previous cardioversions: none Previous ablations: none Anticoagulation history: Eliquis   ROS- All systems are reviewed and negative except as per the HPI above.  Physical Exam: BP (!) 128/98   Pulse (!) 120   Ht 5\' 7"  (1.702 m)   Wt 98.4 kg   BMI 33.99 kg/m   GEN: Well nourished, well developed in no  acute distress NECK: No JVD; No carotid bruits CARDIAC: Irregularly irregular tachycardic rate and rhythm, no murmurs, rubs, gallops RESPIRATORY:  Clear to auscultation without rales, wheezing or rhonchi  ABDOMEN: Soft, non-tender, non-distended EXTREMITIES:  No edema; No deformity   EKG today demonstrates  Vent. rate 120 BPM PR interval * ms QRS duration 82 ms QT/QTcB 310/438 ms P-R-T axes * 58 3 Atrial fibrillation with rapid ventricular response Low voltage QRS Abnormal ECG No previous ECGs available  Echo 03/07/21 demonstrated  1. Left ventricular ejection fraction, by estimation, is 60 to 65%. The  left ventricle has normal function. The left ventricle has no regional  wall  motion abnormalities. Left ventricular diastolic parameters are  consistent with Grade I diastolic  dysfunction (impaired relaxation).   2. Right ventricular systolic function is normal. The right ventricular  size is normal. There is normal pulmonary artery systolic pressure. The  estimated right ventricular systolic pressure is 25.8 mmHg.   3. The mitral valve is normal in structure. Trivial mitral valve  regurgitation.   4. The aortic valve is tricuspid. There is mild calcification of the  aortic valve. There is mild thickening of the aortic valve. Aortic valve  regurgitation is not visualized. Aortic valve sclerosis is present, with  no evidence of aortic valve stenosis.   5. Aortic dilatation noted. There is mild dilatation of the ascending  aorta, measuring 38 mm.   6. The inferior vena cava is normal in size with greater than 50%  respiratory variability, suggesting right atrial pressure of 3 mmHg.    ASSESSMENT & PLAN CHA2DS2-VASc Score = 3  The patient's score is based upon: CHF History: 0 HTN History: 1 Diabetes History: 0 Stroke History: 0 Vascular Disease History: 0 Age Score: 1 Gender Score: 1       ASSESSMENT AND PLAN: Persistent Atrial Fibrillation (ICD10:  I48.19) The patient's CHA2DS2-VASc score is 3, indicating a 3.2% annual risk of stroke.    She is in Afib with RVR. Education provided about Afib. Discussion about medication treatments and ablation going forward if there is ERAF after cardioversion. After discussion, patient agrees would like to try to convert to NSR. She is in agreement with proceeding with cardioversion. She has to secure a ride for the day of cardioversion and will contact us  back to schedule cardioversion. Labs drawn today. Diltiazem  increased to 180 mg daily for improved rate control. Rhythm monitoring device recommended.   Informed Consent   Shared Decision Making/Informed Consent The risks (stroke, cardiac arrhythmias rarely resulting  in the need for a temporary or permanent pacemaker, skin irritation or burns and complications associated with conscious sedation including aspiration, arrhythmia, respiratory failure and death), benefits (restoration of normal sinus rhythm) and alternatives of a direct current cardioversion were explained in detail to Ms. Stai and she agrees to proceed.      Secondary Hypercoagulable State (ICD10:  D68.69) The patient is at significant risk for stroke/thromboembolism based upon her CHA2DS2-VASc Score of 3.  Continue Apixaban (Eliquis).  She has been on Eliquis before Afib diagnosis; no missed doses.     Patient will call clinic to schedule cardioversion.    Terra Pac, PA-C  Afib Clinic [facility] [address] [place], [place] [address] [phone]

## 2023-03-31 ENCOUNTER — Other Ambulatory Visit (HOSPITAL_COMMUNITY): Payer: Self-pay | Admitting: *Deleted

## 2023-03-31 DIAGNOSIS — I4891 Unspecified atrial fibrillation: Secondary | ICD-10-CM

## 2023-04-08 NOTE — Progress Notes (Signed)
Unable to reach patient about procedure, but was able to leave a detailed message. Stated that the patient needed to arrive at the hospital at 0915 , remain NPO after 0000, needs to have a ride home and a responsible adult to stay with them for 24 hours after the procedure. Instructed the patient to call back if they had any questions.

## 2023-04-09 ENCOUNTER — Ambulatory Visit (HOSPITAL_BASED_OUTPATIENT_CLINIC_OR_DEPARTMENT_OTHER): Payer: Medicare Other | Admitting: Anesthesiology

## 2023-04-09 ENCOUNTER — Ambulatory Visit (HOSPITAL_COMMUNITY): Payer: Medicare Other | Admitting: Anesthesiology

## 2023-04-09 ENCOUNTER — Ambulatory Visit (HOSPITAL_COMMUNITY)
Admission: RE | Admit: 2023-04-09 | Discharge: 2023-04-09 | Disposition: A | Discharge status: Home / Self Care | Payer: Medicare Other | Attending: Internal Medicine | Admitting: Internal Medicine

## 2023-04-09 ENCOUNTER — Other Ambulatory Visit: Payer: Self-pay

## 2023-04-09 ENCOUNTER — Encounter (HOSPITAL_COMMUNITY)
Admission: RE | Disposition: A | Discharge status: Home / Self Care | Payer: Self-pay | Source: Home / Self Care | Attending: Internal Medicine

## 2023-04-09 ENCOUNTER — Encounter (HOSPITAL_COMMUNITY): Payer: Self-pay | Admitting: Internal Medicine

## 2023-04-09 DIAGNOSIS — J45909 Unspecified asthma, uncomplicated: Secondary | ICD-10-CM | POA: Diagnosis not present

## 2023-04-09 DIAGNOSIS — E039 Hypothyroidism, unspecified: Secondary | ICD-10-CM | POA: Diagnosis not present

## 2023-04-09 DIAGNOSIS — D6869 Other thrombophilia: Secondary | ICD-10-CM | POA: Diagnosis not present

## 2023-04-09 DIAGNOSIS — I4891 Unspecified atrial fibrillation: Secondary | ICD-10-CM | POA: Diagnosis not present

## 2023-04-09 DIAGNOSIS — Z86711 Personal history of pulmonary embolism: Secondary | ICD-10-CM | POA: Insufficient documentation

## 2023-04-09 DIAGNOSIS — Z6833 Body mass index (BMI) 33.0-33.9, adult: Secondary | ICD-10-CM | POA: Insufficient documentation

## 2023-04-09 DIAGNOSIS — Z79899 Other long term (current) drug therapy: Secondary | ICD-10-CM | POA: Diagnosis not present

## 2023-04-09 DIAGNOSIS — Z86718 Personal history of other venous thrombosis and embolism: Secondary | ICD-10-CM | POA: Insufficient documentation

## 2023-04-09 DIAGNOSIS — Z7901 Long term (current) use of anticoagulants: Secondary | ICD-10-CM | POA: Diagnosis not present

## 2023-04-09 DIAGNOSIS — E785 Hyperlipidemia, unspecified: Secondary | ICD-10-CM | POA: Diagnosis not present

## 2023-04-09 DIAGNOSIS — E669 Obesity, unspecified: Secondary | ICD-10-CM | POA: Insufficient documentation

## 2023-04-09 DIAGNOSIS — I4819 Other persistent atrial fibrillation: Secondary | ICD-10-CM | POA: Insufficient documentation

## 2023-04-09 DIAGNOSIS — I129 Hypertensive chronic kidney disease with stage 1 through stage 4 chronic kidney disease, or unspecified chronic kidney disease: Secondary | ICD-10-CM | POA: Insufficient documentation

## 2023-04-09 DIAGNOSIS — G4733 Obstructive sleep apnea (adult) (pediatric): Secondary | ICD-10-CM | POA: Diagnosis not present

## 2023-04-09 DIAGNOSIS — N189 Chronic kidney disease, unspecified: Secondary | ICD-10-CM | POA: Insufficient documentation

## 2023-04-09 DIAGNOSIS — I10 Essential (primary) hypertension: Secondary | ICD-10-CM

## 2023-04-09 HISTORY — PX: CARDIOVERSION: EP1203

## 2023-04-09 SURGERY — CARDIOVERSION (CATH LAB)
Anesthesia: General

## 2023-04-09 MED ORDER — SODIUM CHLORIDE 0.9% FLUSH: 3.0000 mL | Freq: Two times a day (BID) | INTRAVENOUS | Status: DC

## 2023-04-09 MED ORDER — LIDOCAINE 2% (20 MG/ML) 5 ML SYRINGE
INTRAMUSCULAR | Status: DC | PRN
  Administered 2023-04-09: 50 mg via INTRAVENOUS

## 2023-04-09 MED ORDER — SODIUM CHLORIDE 0.9% FLUSH
3.0000 mL | INTRAVENOUS | Status: DC | PRN
Start: 2023-04-09 — End: 2023-04-09

## 2023-04-09 MED ORDER — PROPOFOL 10 MG/ML IV BOLUS
INTRAVENOUS | Status: DC | PRN
  Administered 2023-04-09: 50 mg via INTRAVENOUS

## 2023-04-09 SURGICAL SUPPLY — 1 items: PAD DEFIB RADIO PHYSIO CONN (PAD) ×1 IMPLANT

## 2023-04-09 NOTE — CV Procedure (Signed)
    CARDIOVERSION NOTE  Procedure: Electrical Cardioversion Indications:  Atrial Fibrillation  Procedure Details:  Consent: Risks of procedure as well as the alternatives and risks of each were explained to the (patient/caregiver).  Consent for procedure obtained.  Time Out: Verified patient identification, verified procedure, site/side was marked, verified correct patient position, special equipment/implants available, medications/allergies/relevent history reviewed, required imaging and test results available.  Performed  Patient placed on cardiac monitor, pulse oximetry, supplemental oxygen as necessary.  Sedation given:  propofol per anesthesia Pacer pads placed anterior and posterior chest.  Cardioverted 1 time(s).  Cardioverted at 200J biphasic.  Impression: Findings: Post procedure EKG shows: NSR Complications: None Patient did tolerate procedure well.  Plan: Successful DCCV with a single 200J biphasic shock to NSR.  Time Spent Directly with the Patient:  30 minutes   Chrystie Nose, MD, [facility], FACP  [place]  [facility]  Medical Director of the Advanced Lipid Disorders &  Cardiovascular Risk Reduction Clinic Diplomate of the American Board of Clinical Lipidology Attending Cardiologist  Direct Dial: [phone]  Fax: [phone]  Website:  [email or web address] Nicely Rochelle Larue 04/09/2023, 11:34 AM

## 2023-04-09 NOTE — Anesthesia Postprocedure Evaluation (Signed)
 Anesthesia Post Note  Patient: [name]  Procedure(s) Performed: CARDIOVERSION     Patient location during evaluation: Cath Lab Anesthesia Type: General Level of consciousness: awake and alert Pain management: pain level controlled Vital Signs Assessment: post-procedure vital signs reviewed and stable Respiratory status: spontaneous breathing, nonlabored ventilation, respiratory function stable and patient connected to nasal cannula oxygen Cardiovascular status: blood pressure returned to baseline and stable Postop Assessment: no apparent nausea or vomiting Anesthetic complications: no   No notable events documented.  Last Vitals:  Vitals:   04/09/23 1120 04/09/23 1130  BP: 113/71 115/78  Pulse: 86 79  Resp: 13 12  Temp:  36.7 C  SpO2: 96% 96%    Last Pain:  Vitals:   04/09/23 1130  TempSrc: Temporal  PainSc: 0-No pain                 Collene Schlichter

## 2023-04-09 NOTE — Interval H&P Note (Signed)
 History and Physical Interval Note:  04/09/2023 10:49 AM  [name]  has presented today for surgery, with the diagnosis of AFIB.  The various methods of treatment have been discussed with the patient and family. After consideration of risks, benefits and other options for treatment, the patient has consented to  Procedure(s): CARDIOVERSION (N/A) as a surgical intervention.  The patient's history has been reviewed, patient examined, no change in status, stable for surgery.  I have reviewed the patient's chart and labs.  Questions were answered to the patient's satisfaction.     Chrystie Nose

## 2023-04-09 NOTE — Transfer of Care (Signed)
 Immediate Anesthesia Transfer of Care Note  Patient: [name]  Procedure(s) Performed: CARDIOVERSION  Patient Location: PACU  Anesthesia Type:General  Level of Consciousness: sedated  Airway & Oxygen Therapy: Patient Spontanous Breathing  Post-op Assessment: Report given to RN  Post vital signs: Reviewed and stable see chart  Last Vitals:  Vitals Value Taken Time  BP    Temp    Pulse    Resp    SpO2      Last Pain:  Vitals:   04/09/23 1010  TempSrc:   PainSc: 0-No pain         Complications: No notable events documented.

## 2023-04-09 NOTE — Anesthesia Preprocedure Evaluation (Signed)
 Anesthesia Evaluation  Patient identified by MRN, date of birth, ID band Patient awake    Reviewed: Allergy & Precautions, NPO status , Patient's Chart, lab work & pertinent test results  Airway Mallampati: II  TM Distance: >3 FB Neck ROM: Full    Dental  (+) Teeth Intact, Dental Advisory Given, Caps   Pulmonary asthma , former smoker, PE   Pulmonary exam normal breath sounds clear to auscultation       Cardiovascular hypertension, Pt. on medications + DVT  + dysrhythmias Atrial Fibrillation  Rhythm:Irregular Rate:Abnormal     Neuro/Psych negative neurological ROS     GI/Hepatic negative GI ROS, Neg liver ROS,,,  Endo/Other  negative endocrine ROS  Obesity   Renal/GU Renal InsufficiencyRenal disease     Musculoskeletal  (+) Arthritis ,    Abdominal   Peds  Hematology negative hematology ROS (+)   Anesthesia Other Findings Day of surgery medications reviewed with the patient.  Reproductive/Obstetrics                             Anesthesia Physical Anesthesia Plan  ASA: 3  Anesthesia Plan: General   Post-op Pain Management: Minimal or no pain anticipated   Induction: Intravenous  PONV Risk Score and Plan: 3 and TIVA  Airway Management Planned: Mask  Additional Equipment:   Intra-op Plan:   Post-operative Plan:   Informed Consent: I have reviewed the patients History and Physical, chart, labs and discussed the procedure including the risks, benefits and alternatives for the proposed anesthesia with the patient or authorized representative who has indicated his/her understanding and acceptance.     Dental advisory given  Plan Discussed with: CRNA  Anesthesia Plan Comments:        Anesthesia Quick Evaluation

## 2023-04-09 NOTE — Anesthesia Procedure Notes (Signed)
 Procedure Name: MAC Date/Time: 04/09/2023 10:58 AM  Performed by: Bartholomew Crews, CRNAPre-anesthesia Checklist: Patient identified, Emergency Drugs available, Timeout performed, Suction available and Patient being monitored Patient Re-evaluated:Patient Re-evaluated prior to induction Preoxygenation: Pre-oxygenation with 100% oxygen Induction Type: IV induction Placement Confirmation: positive ETCO2 Dental Injury: Teeth and Oropharynx as per pre-operative assessment

## 2023-04-23 ENCOUNTER — Ambulatory Visit (HOSPITAL_COMMUNITY)
Admission: RE | Admit: 2023-04-23 | Discharge: 2023-04-23 | Disposition: A | Discharge status: Home / Self Care | Payer: Medicare Other | Source: Ambulatory Visit | Attending: Internal Medicine | Admitting: Internal Medicine

## 2023-04-23 VITALS — BP 102/76 | HR 93 | Ht 67.0 in | Wt 214.4 lb

## 2023-04-23 DIAGNOSIS — E039 Hypothyroidism, unspecified: Secondary | ICD-10-CM | POA: Insufficient documentation

## 2023-04-23 DIAGNOSIS — E669 Obesity, unspecified: Secondary | ICD-10-CM | POA: Diagnosis not present

## 2023-04-23 DIAGNOSIS — Z7901 Long term (current) use of anticoagulants: Secondary | ICD-10-CM | POA: Insufficient documentation

## 2023-04-23 DIAGNOSIS — I4819 Other persistent atrial fibrillation: Secondary | ICD-10-CM | POA: Diagnosis not present

## 2023-04-23 DIAGNOSIS — Z6833 Body mass index (BMI) 33.0-33.9, adult: Secondary | ICD-10-CM | POA: Diagnosis not present

## 2023-04-23 DIAGNOSIS — Z79899 Other long term (current) drug therapy: Secondary | ICD-10-CM | POA: Diagnosis not present

## 2023-04-23 DIAGNOSIS — D6869 Other thrombophilia: Secondary | ICD-10-CM | POA: Insufficient documentation

## 2023-04-23 DIAGNOSIS — G4733 Obstructive sleep apnea (adult) (pediatric): Secondary | ICD-10-CM | POA: Diagnosis not present

## 2023-04-23 DIAGNOSIS — Z86711 Personal history of pulmonary embolism: Secondary | ICD-10-CM | POA: Insufficient documentation

## 2023-04-23 DIAGNOSIS — E785 Hyperlipidemia, unspecified: Secondary | ICD-10-CM | POA: Insufficient documentation

## 2023-04-23 DIAGNOSIS — Z86718 Personal history of other venous thrombosis and embolism: Secondary | ICD-10-CM | POA: Diagnosis not present

## 2023-04-23 DIAGNOSIS — N189 Chronic kidney disease, unspecified: Secondary | ICD-10-CM | POA: Diagnosis not present

## 2023-04-23 DIAGNOSIS — I480 Paroxysmal atrial fibrillation: Secondary | ICD-10-CM | POA: Diagnosis present

## 2023-04-23 DIAGNOSIS — I129 Hypertensive chronic kidney disease with stage 1 through stage 4 chronic kidney disease, or unspecified chronic kidney disease: Secondary | ICD-10-CM | POA: Insufficient documentation

## 2023-04-23 NOTE — Progress Notes (Signed)
 Primary Care Physician: Chilton Greathouse, MD Primary Cardiologist: None Electrophysiologist: None     Referring Physician: Dr. Myrtie Cruise is a 73 y.o. female with a history of OSA not on CPAP, history of PE/DVT in 2017, hypothyroidism, obesity, HLD, CKD, HTN, and paroxysmal atrial fibrillation who presents for consultation in the [facility]. Seen by PCP on 03/12/23 noted to be in Afib with RVR. Already on Eliquis 5 mg BID. Started on diltiazem 120 mg daily. Patient is on Eliquis 5 mg BID for a CHADS2VASC score of 3.  On evaluation today, she is currently in Afib with RVR. Patient has been taking diltiazem 120 mg daily for the past few days. She refused in the past to use any device for her CPAP. She has been on Eliquis 5 mg BID due to history of PE/DVT. No missed doses of Eliquis.   On follow up 04/23/23, she is currently in NSR. S/p successful DCCV on 04/09/23. No missed doses of Eliquis 5 mg BID.   Today, she denies symptoms of palpitations, chest pain, shortness of breath, orthopnea, PND, lower extremity edema, dizziness, presyncope, syncope, snoring, daytime somnolence, bleeding, or neurologic sequela. The patient is tolerating medications without difficulties and is otherwise without complaint today.    Atrial Fibrillation Risk Factors:  she does have symptoms or diagnosis of sleep apnea. she is not compliant with CPAP therapy.  she has a BMI of Body mass index is 33.58 kg/m.Marland Kitchen Filed Weights   04/23/23 1327  Weight: 97.3 kg     Current Outpatient Medications  Medication Sig Dispense Refill   albuterol (VENTOLIN HFA) 108 (90 Base) MCG/ACT inhaler Inhale 1-2 puffs into the lungs every 6 (six) hours as needed for shortness of breath or wheezing.     alendronate (FOSAMAX) 70 MG tablet Take 70 mg by mouth every Sunday.     apixaban (ELIQUIS) 5 MG TABS tablet Take 5 mg by mouth 2 (two) times daily.     aspirin EC 325 MG tablet Take 650 mg  by mouth daily in the afternoon.     B Complex Vitamins (B COMPLEX 100 PO) Take 1 tablet by mouth daily.     cetirizine (ZYRTEC ALLERGY) 10 MG tablet Take 10 mg by mouth daily.     Coenzyme Q10 (CO Q 10) 100 MG CAPS Take 100 mg by mouth daily.     diltiazem (CARDIZEM CD) 180 MG 24 hr capsule Take 1 capsule (180 mg total) by mouth daily. 30 capsule 11   fluticasone (FLONASE) 50 MCG/ACT nasal spray Place 2 sprays into both nostrils daily.     Fluticasone-Salmeterol (ADVAIR DISKUS IN) Inhale 2 puffs into the lungs 2 (two) times daily. Advair 250-25 dose     losartan (COZAAR) 50 MG tablet Take 50 mg by mouth 2 (two) times daily.     rosuvastatin (CRESTOR) 5 MG tablet Take 5 mg by mouth daily.     Vitamin D, Ergocalciferol, (DRISDOL) 50000 UNITS CAPS capsule Take 50,000 Units by mouth every Monday.     vitamin E 180 MG (400 UNITS) capsule Take 400 Units by mouth 2 (two) times daily.     No current facility-administered medications for this encounter.    Atrial Fibrillation Management history:  Previous antiarrhythmic drugs: none Previous cardioversions: 04/09/23 Previous ablations: none Anticoagulation history: Eliquis   ROS- All systems are reviewed and negative except as per the HPI above.  Physical Exam: BP 102/76   Pulse  93   Ht 5\' 7"  (1.702 m)   Wt 97.3 kg   BMI 33.58 kg/m   GEN- The patient is well appearing, alert and oriented x 3 today.   Neck - no JVD or carotid bruit noted Lungs- Clear to ausculation bilaterally, normal work of breathing Heart- Regular rate and rhythm, no murmurs, rubs or gallops, PMI not laterally displaced Extremities- no clubbing, cyanosis, or edema Skin - no rash or ecchymosis noted   EKG today demonstrates  Vent. rate 93 BPM PR interval 158 ms QRS duration 82 ms QT/QTcB 350/435 ms P-R-T axes 52 69 11 Normal sinus rhythm Low voltage QRS Borderline ECG When compared with ECG of 09-Apr-2023 11:12, PREVIOUS ECG IS PRESENT  Echo 03/07/21  demonstrated  1. Left ventricular ejection fraction, by estimation, is 60 to 65%. The  left ventricle has normal function. The left ventricle has no regional  wall motion abnormalities. Left ventricular diastolic parameters are  consistent with Grade I diastolic  dysfunction (impaired relaxation).   2. Right ventricular systolic function is normal. The right ventricular  size is normal. There is normal pulmonary artery systolic pressure. The  estimated right ventricular systolic pressure is 25.8 mmHg.   3. The mitral valve is normal in structure. Trivial mitral valve  regurgitation.   4. The aortic valve is tricuspid. There is mild calcification of the  aortic valve. There is mild thickening of the aortic valve. Aortic valve  regurgitation is not visualized. Aortic valve sclerosis is present, with  no evidence of aortic valve stenosis.   5. Aortic dilatation noted. There is mild dilatation of the ascending  aorta, measuring 38 mm.   6. The inferior vena cava is normal in size with greater than 50%  respiratory variability, suggesting right atrial pressure of 3 mmHg.    ASSESSMENT & PLAN CHA2DS2-VASc Score = 3  The patient's score is based upon: CHF History: 0 HTN History: 1 Diabetes History: 0 Stroke History: 0 Vascular Disease History: 0 Age Score: 1 Gender Score: 1       ASSESSMENT AND PLAN: Persistent Atrial Fibrillation (ICD10:  I48.19) The patient's CHA2DS2-VASc score is 3, indicating a 3.2% annual risk of stroke.   S/p successful DCCV on 04/09/23.  She is currently in NSR. She is able to do daily activities without issue. Continue diltiazem 180 mg daily. Monitor BP at home. Recommended Kardiamobile device for monitoring rhythm at home.   Secondary Hypercoagulable State (ICD10:  D68.69) The patient is at significant risk for stroke/thromboembolism based upon her CHA2DS2-VASc Score of 3.  Continue Apixaban (Eliquis).  Continue Eliquis 5 mg BID without  interruption.    Follow up 6 months Afib clinic.    Lake Bells, PA-C  Afib Clinic [facility] [address] [place], [place] [address] [phone]

## 2023-04-23 NOTE — Patient Instructions (Signed)
 Kardia mobile (1 lead is ok)

## 2023-06-19 DIAGNOSIS — E039 Hypothyroidism, unspecified: Secondary | ICD-10-CM | POA: Diagnosis not present

## 2023-10-23 ENCOUNTER — Ambulatory Visit (HOSPITAL_COMMUNITY)
Admission: RE | Admit: 2023-10-23 | Discharge: 2023-10-23 | Disposition: A | Discharge status: Home / Self Care | Source: Ambulatory Visit | Attending: Internal Medicine | Admitting: Internal Medicine

## 2023-10-23 ENCOUNTER — Encounter (HOSPITAL_COMMUNITY): Payer: Self-pay | Admitting: Internal Medicine

## 2023-10-23 VITALS — BP 118/72 | HR 97 | Ht 67.0 in | Wt 205.4 lb

## 2023-10-23 DIAGNOSIS — I4819 Other persistent atrial fibrillation: Secondary | ICD-10-CM | POA: Diagnosis not present

## 2023-10-23 DIAGNOSIS — D6869 Other thrombophilia: Secondary | ICD-10-CM | POA: Insufficient documentation

## 2023-10-23 MED ORDER — DILTIAZEM HCL ER COATED BEADS 180 MG PO CP24: 180.0000 mg | ORAL_CAPSULE | Freq: Every day | ORAL | 3 refills | Status: AC

## 2023-10-23 NOTE — Progress Notes (Signed)
 Primary Care Physician: Janey Santos, MD Primary Cardiologist: None Electrophysiologist: None     Referring Physician: Dr. Janey Rock [name] is a 73 y.o. female with a history of OSA not on CPAP, history of PE/DVT in 2017, hypothyroidism, obesity, HLD, CKD, HTN, and paroxysmal atrial fibrillation who presents for consultation in the [facility]. Seen by PCP on 03/12/23 noted to be in Afib with RVR. Already on Eliquis 5 mg BID. Started on diltiazem  120 mg daily. Patient is on Eliquis 5 mg BID for a CHADS2VASC score of 3. S/p DCCV on 04/09/23.   On follow up 10/23/23, patient is currently in NSR. She has had overall low Afib burden. She tends to notice heart racing in the morning time. No bleeding issues on Eliquis.  Today, she denies symptoms of palpitations, chest pain, shortness of breath, orthopnea, PND, lower extremity edema, dizziness, presyncope, syncope, snoring, daytime somnolence, bleeding, or neurologic sequela. The patient is tolerating medications without difficulties and is otherwise without complaint today.    Atrial Fibrillation Risk Factors:  she does have symptoms or diagnosis of sleep apnea. she is not compliant with CPAP therapy.  she has a BMI of Body mass index is 32.17 kg/m.[name] Filed Weights   10/23/23 1322  Weight: 93.2 kg      Current Outpatient Medications  Medication Sig Dispense Refill   albuterol (VENTOLIN HFA) 108 (90 Base) MCG/ACT inhaler Inhale 1-2 puffs into the lungs every 6 (six) hours as needed for shortness of breath or wheezing.     alendronate (FOSAMAX) 70 MG tablet Take 70 mg by mouth every Sunday.     apixaban (ELIQUIS) 5 MG TABS tablet Take 5 mg by mouth 2 (two) times daily.     aspirin EC 325 MG tablet Take 650 mg by mouth daily in the afternoon.     B Complex Vitamins (B COMPLEX 100 PO) Take 1 tablet by mouth daily.     cetirizine (ZYRTEC ALLERGY) 10 MG tablet Take 10 mg by mouth daily.     Coenzyme  Q10 (CO Q 10) 100 MG CAPS Take 100 mg by mouth daily.     fluticasone (FLONASE) 50 MCG/ACT nasal spray Place 2 sprays into both nostrils daily.     Fluticasone-Salmeterol (ADVAIR DISKUS IN) Inhale 2 puffs into the lungs 2 (two) times daily. Advair 250-25 dose     losartan (COZAAR) 50 MG tablet Take 50 mg by mouth 2 (two) times daily.     rosuvastatin (CRESTOR) 5 MG tablet Take 5 mg by mouth daily.     Vitamin D, Ergocalciferol, (DRISDOL) 50000 UNITS CAPS capsule Take 50,000 Units by mouth every Monday.     vitamin E 180 MG (400 UNITS) capsule Take 400 Units by mouth 2 (two) times daily.     diltiazem  (CARDIZEM  CD) 180 MG 24 hr capsule Take 1 capsule (180 mg total) by mouth daily. 90 capsule 3   No current facility-administered medications for this encounter.    Atrial Fibrillation Management history:  Previous antiarrhythmic drugs: none Previous cardioversions: 04/09/23 Previous ablations: none Anticoagulation history: Eliquis   ROS- All systems are reviewed and negative except as per the HPI above.  Physical Exam: BP 118/72   Pulse 97   Ht 5' 7 (1.702 m)   Wt 93.2 kg   BMI 32.17 kg/m   GEN- The patient is well appearing, alert and oriented x 3 today.   Neck - no JVD or carotid bruit  noted Lungs- Clear to ausculation bilaterally, normal work of breathing Heart- Regular rate and rhythm, no murmurs, rubs or gallops, PMI not laterally displaced Extremities- no clubbing, cyanosis, or edema Skin - no rash or ecchymosis noted   EKG today demonstrates  Vent. rate 97 BPM PR interval 156 ms QRS duration 82 ms QT/QTcB 340/431 ms P-R-T axes 44 34 21 Normal sinus rhythm Normal ECG When compared with ECG of 23-Apr-2023 13:38, No significant change was found  Echo 03/07/21 demonstrated  1. Left ventricular ejection fraction, by estimation, is 60 to 65%. The  left ventricle has normal function. The left ventricle has no regional  wall motion abnormalities. Left ventricular  diastolic parameters are  consistent with Grade I diastolic  dysfunction (impaired relaxation).   2. Right ventricular systolic function is normal. The right ventricular  size is normal. There is normal pulmonary artery systolic pressure. The  estimated right ventricular systolic pressure is 25.8 mmHg.   3. The mitral valve is normal in structure. Trivial mitral valve  regurgitation.   4. The aortic valve is tricuspid. There is mild calcification of the  aortic valve. There is mild thickening of the aortic valve. Aortic valve  regurgitation is not visualized. Aortic valve sclerosis is present, with  no evidence of aortic valve stenosis.   5. Aortic dilatation noted. There is mild dilatation of the ascending  aorta, measuring 38 mm.   6. The inferior vena cava is normal in size with greater than 50%  respiratory variability, suggesting right atrial pressure of 3 mmHg.    ASSESSMENT & PLAN CHA2DS2-VASc Score = 3  The patient's score is based upon: CHF History: 0 HTN History: 1 Diabetes History: 0 Stroke History: 0 Vascular Disease History: 0 Age Score: 1 Gender Score: 1       ASSESSMENT AND PLAN: Persistent Atrial Fibrillation (ICD10:  I48.19) The patient's CHA2DS2-VASc score is 3, indicating a 3.2% annual risk of stroke.   S/p successful DCCV on 04/09/23.  Patient is currently in NSR. She will change time of day she is taking diltiazem  180 mg daily and let us  know in a couple of weeks if helps. If not, can consider increase to 240 mg daily.    Secondary Hypercoagulable State (ICD10:  D68.69) The patient is at significant risk for stroke/thromboembolism based upon her CHA2DS2-VASc Score of 3.  Continue Apixaban (Eliquis).  Continue Eliquis.     Follow up 6 months Afib clinic.    Traci Pac, PA-C  Afib Clinic [facility] [address] [place], [place] [address] [phone]

## 2023-11-15 DIAGNOSIS — Z23 Encounter for immunization: Secondary | ICD-10-CM | POA: Diagnosis not present

## 2024-04-22 ENCOUNTER — Ambulatory Visit (HOSPITAL_COMMUNITY): Admitting: Internal Medicine
